# Patient Record
Sex: Male | Born: 1955 | Race: White | Hispanic: No | Marital: Single
Health system: Southern US, Community
[De-identification: ages and names within clinical notes are randomized; demographics above are authoritative.]

---

## 2014-07-04 ENCOUNTER — Other Ambulatory Visit: Payer: Self-pay

## 2014-07-04 ENCOUNTER — Other Ambulatory Visit (HOSPITAL_COMMUNITY): Payer: Self-pay

## 2014-07-04 ENCOUNTER — Inpatient Hospital Stay
Admission: AD | Admit: 2014-07-04 | Discharge: 2014-08-20 | Disposition: E | Payer: Self-pay | Source: Ambulatory Visit | Attending: Internal Medicine | Admitting: Internal Medicine

## 2014-07-04 DIAGNOSIS — J9690 Respiratory failure, unspecified, unspecified whether with hypoxia or hypercapnia: Secondary | ICD-10-CM | POA: Insufficient documentation

## 2014-07-04 DIAGNOSIS — J9601 Acute respiratory failure with hypoxia: Secondary | ICD-10-CM

## 2014-07-04 DIAGNOSIS — I2699 Other pulmonary embolism without acute cor pulmonale: Secondary | ICD-10-CM

## 2014-07-04 DIAGNOSIS — Z89519 Acquired absence of unspecified leg below knee: Secondary | ICD-10-CM

## 2014-07-04 DIAGNOSIS — R079 Chest pain, unspecified: Secondary | ICD-10-CM

## 2014-07-04 DIAGNOSIS — J189 Pneumonia, unspecified organism: Secondary | ICD-10-CM

## 2014-07-04 DIAGNOSIS — I739 Peripheral vascular disease, unspecified: Secondary | ICD-10-CM

## 2014-07-04 DIAGNOSIS — Z720 Tobacco use: Secondary | ICD-10-CM

## 2014-07-04 DIAGNOSIS — I509 Heart failure, unspecified: Secondary | ICD-10-CM

## 2014-07-04 DIAGNOSIS — I25810 Atherosclerosis of coronary artery bypass graft(s) without angina pectoris: Secondary | ICD-10-CM

## 2014-07-04 DIAGNOSIS — J441 Chronic obstructive pulmonary disease with (acute) exacerbation: Secondary | ICD-10-CM

## 2014-07-04 DIAGNOSIS — I639 Cerebral infarction, unspecified: Secondary | ICD-10-CM

## 2014-07-04 DIAGNOSIS — J811 Chronic pulmonary edema: Secondary | ICD-10-CM

## 2014-07-04 DIAGNOSIS — K219 Gastro-esophageal reflux disease without esophagitis: Secondary | ICD-10-CM

## 2014-07-04 DIAGNOSIS — Z95828 Presence of other vascular implants and grafts: Secondary | ICD-10-CM

## 2014-07-04 DIAGNOSIS — R627 Adult failure to thrive: Secondary | ICD-10-CM

## 2014-07-05 LAB — COMPREHENSIVE METABOLIC PANEL
ALT: 20 U/L (ref 0–53)
ANION GAP: 13 (ref 5–15)
AST: 22 U/L (ref 0–37)
Albumin: 2.3 g/dL — ABNORMAL LOW (ref 3.5–5.2)
Alkaline Phosphatase: 199 U/L — ABNORMAL HIGH (ref 39–117)
BUN: 11 mg/dL (ref 6–23)
CO2: 32 meq/L (ref 19–32)
Calcium: 8.2 mg/dL — ABNORMAL LOW (ref 8.4–10.5)
Chloride: 99 mEq/L (ref 96–112)
Creatinine, Ser: 0.62 mg/dL (ref 0.50–1.35)
GFR calc Af Amer: >90 mL/min (ref >90)
Glucose, Bld: 226 mg/dL — ABNORMAL HIGH (ref 70–99)
Potassium: 3.8 mEq/L (ref 3.7–5.3)
Sodium: 144 mEq/L (ref 137–147)
TOTAL PROTEIN: 6.5 g/dL (ref 6.0–8.3)
Total Bilirubin: 0.8 mg/dL (ref 0.3–1.2)

## 2014-07-05 LAB — CBC WITH DIFFERENTIAL/PLATELET
Basophils Absolute: 0 10*3/uL (ref 0.0–0.1)
Basophils Relative: 0 % (ref 0–1)
EOS ABS: 0.3 10*3/uL (ref 0.0–0.7)
EOS PCT: 2 % (ref 0–5)
HCT: 37.6 % — ABNORMAL LOW (ref 39.0–52.0)
Hemoglobin: 11.4 g/dL — ABNORMAL LOW (ref 13.0–17.0)
LYMPHS ABS: 1.5 10*3/uL (ref 0.7–4.0)
LYMPHS PCT: 12 % (ref 12–46)
MCH: 25.3 pg — AB (ref 26.0–34.0)
MCHC: 30.3 g/dL (ref 30.0–36.0)
MCV: 83.6 fL (ref 78.0–100.0)
MONOS PCT: 7 % (ref 3–12)
Monocytes Absolute: 0.8 10*3/uL (ref 0.1–1.0)
Neutro Abs: 9.7 10*3/uL — ABNORMAL HIGH (ref 1.7–7.7)
Neutrophils Relative %: 79 % — ABNORMAL HIGH (ref 43–77)
Platelets: 258 10*3/uL (ref 150–400)
RBC: 4.5 MIL/uL (ref 4.22–5.81)
RDW: 20.2 % — ABNORMAL HIGH (ref 11.5–15.5)
WBC: 12.3 10*3/uL — AB (ref 4.0–10.5)

## 2014-07-05 LAB — HEMOGLOBIN A1C
HEMOGLOBIN A1C: 9.8 % — AB (ref <5.7)
Mean Plasma Glucose: 235 mg/dL — ABNORMAL HIGH (ref <117)

## 2014-07-05 LAB — PRO B NATRIURETIC PEPTIDE: Pro B Natriuretic peptide (BNP): 6451 pg/mL — ABNORMAL HIGH (ref 0–125)

## 2014-07-05 LAB — TSH: TSH: 2.12 u[IU]/mL (ref 0.350–4.500)

## 2014-07-05 LAB — PROTIME-INR
INR: 2.47 — AB (ref 0.00–1.49)
PROTHROMBIN TIME: 27 s — AB (ref 11.6–15.2)

## 2014-07-05 LAB — VITAMIN B12: Vitamin B-12: 1286 pg/mL — ABNORMAL HIGH (ref 211–911)

## 2014-07-05 LAB — PHOSPHORUS: Phosphorus: 2.9 mg/dL (ref 2.3–4.6)

## 2014-07-05 LAB — MAGNESIUM: Magnesium: 1.6 mg/dL (ref 1.5–2.5)

## 2014-07-05 LAB — T4, FREE: FREE T4: 1.42 ng/dL (ref 0.80–1.80)

## 2014-07-06 LAB — CBC WITH DIFFERENTIAL/PLATELET
BASOS PCT: 0 % (ref 0–1)
Basophils Absolute: 0 10*3/uL (ref 0.0–0.1)
EOS PCT: 2 % (ref 0–5)
Eosinophils Absolute: 0.2 10*3/uL (ref 0.0–0.7)
HCT: 37.3 % — ABNORMAL LOW (ref 39.0–52.0)
Hemoglobin: 11.7 g/dL — ABNORMAL LOW (ref 13.0–17.0)
Lymphocytes Relative: 12 % (ref 12–46)
Lymphs Abs: 1.7 10*3/uL (ref 0.7–4.0)
MCH: 25.9 pg — AB (ref 26.0–34.0)
MCHC: 31.4 g/dL (ref 30.0–36.0)
MCV: 82.7 fL (ref 78.0–100.0)
MONOS PCT: 5 % (ref 3–12)
Monocytes Absolute: 0.7 10*3/uL (ref 0.1–1.0)
NEUTROS PCT: 81 % — AB (ref 43–77)
Neutro Abs: 10.9 10*3/uL — ABNORMAL HIGH (ref 1.7–7.7)
PLATELETS: 231 10*3/uL (ref 150–400)
RBC: 4.51 MIL/uL (ref 4.22–5.81)
RDW: 20 % — ABNORMAL HIGH (ref 11.5–15.5)
WBC: 13.6 10*3/uL — ABNORMAL HIGH (ref 4.0–10.5)

## 2014-07-06 LAB — BASIC METABOLIC PANEL
Anion gap: 15 (ref 5–15)
BUN: 8 mg/dL (ref 6–23)
CO2: 32 mEq/L (ref 19–32)
Calcium: 8.1 mg/dL — ABNORMAL LOW (ref 8.4–10.5)
Chloride: 95 mEq/L — ABNORMAL LOW (ref 96–112)
Creatinine, Ser: 0.64 mg/dL (ref 0.50–1.35)
Glucose, Bld: 156 mg/dL — ABNORMAL HIGH (ref 70–99)
POTASSIUM: 3 meq/L — AB (ref 3.7–5.3)
Sodium: 142 mEq/L (ref 137–147)

## 2014-07-06 LAB — URINALYSIS, ROUTINE W REFLEX MICROSCOPIC
BILIRUBIN URINE: NEGATIVE
Glucose, UA: NEGATIVE mg/dL
HGB URINE DIPSTICK: NEGATIVE
Ketones, ur: NEGATIVE mg/dL
Leukocytes, UA: NEGATIVE
Nitrite: NEGATIVE
PROTEIN: NEGATIVE mg/dL
SPECIFIC GRAVITY, URINE: 1.012 (ref 1.005–1.030)
Urobilinogen, UA: 0.2 mg/dL (ref 0.0–1.0)
pH: 7.5 (ref 5.0–8.0)

## 2014-07-06 LAB — PROTIME-INR
INR: 3.29 — ABNORMAL HIGH (ref 0.00–1.49)
Prothrombin Time: 33.7 seconds — ABNORMAL HIGH (ref 11.6–15.2)

## 2014-07-06 LAB — PHOSPHORUS: PHOSPHORUS: 3.1 mg/dL (ref 2.3–4.6)

## 2014-07-06 LAB — MAGNESIUM
Magnesium: 1.3 mg/dL — ABNORMAL LOW (ref 1.5–2.5)
Magnesium: 1.8 mg/dL (ref 1.5–2.5)

## 2014-07-06 LAB — FOLATE RBC

## 2014-07-06 LAB — POTASSIUM: Potassium: 3.2 mEq/L — ABNORMAL LOW (ref 3.7–5.3)

## 2014-07-07 ENCOUNTER — Other Ambulatory Visit (HOSPITAL_COMMUNITY): Payer: Self-pay

## 2014-07-07 LAB — BLOOD GAS, ARTERIAL
ACID-BASE EXCESS: 9.6 mmol/L — AB (ref 0.0–2.0)
Acid-Base Excess: 9.9 mmol/L — ABNORMAL HIGH (ref 0.0–2.0)
BICARBONATE: 33.2 meq/L — AB (ref 20.0–24.0)
Bicarbonate: 33.3 mEq/L — ABNORMAL HIGH (ref 20.0–24.0)
Delivery systems: POSITIVE
Expiratory PAP: 5
FIO2: 1 %
FIO2: 1 %
Inspiratory PAP: 10
O2 Content: 40 L/min
O2 Saturation: 86.8 %
O2 Saturation: 93.5 %
PATIENT TEMPERATURE: 98.6
PCO2 ART: 39.6 mmHg (ref 35.0–45.0)
PCO2 ART: 40.6 mmHg (ref 35.0–45.0)
PH ART: 7.535 — AB (ref 7.350–7.450)
PO2 ART: 70.8 mmHg — AB (ref 80.0–100.0)
Patient temperature: 98.6
RATE: 12 resp/min
TCO2: 34.5 mmol/L (ref 0–100)
TCO2: 34.4 mmol/L (ref 0–100)
pH, Arterial: 7.523 — ABNORMAL HIGH (ref 7.350–7.450)
pO2, Arterial: 54.3 mmHg — ABNORMAL LOW (ref 80.0–100.0)

## 2014-07-07 LAB — CBC WITH DIFFERENTIAL/PLATELET
BASOS PCT: 0 % (ref 0–1)
Basophils Absolute: 0 10*3/uL (ref 0.0–0.1)
Eosinophils Absolute: 0.3 10*3/uL (ref 0.0–0.7)
Eosinophils Relative: 2 % (ref 0–5)
HEMATOCRIT: 38.6 % — AB (ref 39.0–52.0)
HEMOGLOBIN: 11.9 g/dL — AB (ref 13.0–17.0)
LYMPHS ABS: 1.2 10*3/uL (ref 0.7–4.0)
LYMPHS PCT: 11 % — AB (ref 12–46)
MCH: 25.8 pg — ABNORMAL LOW (ref 26.0–34.0)
MCHC: 30.8 g/dL (ref 30.0–36.0)
MCV: 83.5 fL (ref 78.0–100.0)
MONO ABS: 0.8 10*3/uL (ref 0.1–1.0)
MONOS PCT: 7 % (ref 3–12)
NEUTROS PCT: 80 % — AB (ref 43–77)
Neutro Abs: 9 10*3/uL — ABNORMAL HIGH (ref 1.7–7.7)
Platelets: 240 10*3/uL (ref 150–400)
RBC: 4.62 MIL/uL (ref 4.22–5.81)
RDW: 20.1 % — ABNORMAL HIGH (ref 11.5–15.5)
WBC: 11.3 10*3/uL — AB (ref 4.0–10.5)

## 2014-07-07 LAB — BASIC METABOLIC PANEL
Anion gap: 9 (ref 5–15)
BUN: 8 mg/dL (ref 6–23)
CHLORIDE: 91 meq/L — AB (ref 96–112)
CO2: 35 meq/L — AB (ref 19–32)
CREATININE: 0.71 mg/dL (ref 0.50–1.35)
Calcium: 8.4 mg/dL (ref 8.4–10.5)
GFR calc non Af Amer: >90 mL/min (ref >90)
GLUCOSE: 145 mg/dL — AB (ref 70–99)
POTASSIUM: 3 meq/L — AB (ref 3.7–5.3)
Sodium: 135 mEq/L — ABNORMAL LOW (ref 137–147)

## 2014-07-07 LAB — MAGNESIUM: Magnesium: 1.9 mg/dL (ref 1.5–2.5)

## 2014-07-07 LAB — PHOSPHORUS: PHOSPHORUS: 2.6 mg/dL (ref 2.3–4.6)

## 2014-07-07 LAB — PROTIME-INR
INR: 2.45 — ABNORMAL HIGH (ref 0.00–1.49)
PROTHROMBIN TIME: 26.8 s — AB (ref 11.6–15.2)

## 2014-07-08 LAB — URINE CULTURE: Special Requests: NORMAL

## 2014-07-08 LAB — POTASSIUM: POTASSIUM: 4 meq/L (ref 3.7–5.3)

## 2014-07-08 LAB — PROTIME-INR
INR: 2.08 — AB (ref 0.00–1.49)
PROTHROMBIN TIME: 23.6 s — AB (ref 11.6–15.2)

## 2014-07-09 ENCOUNTER — Other Ambulatory Visit (HOSPITAL_COMMUNITY): Payer: Self-pay

## 2014-07-09 DIAGNOSIS — I5031 Acute diastolic (congestive) heart failure: Secondary | ICD-10-CM

## 2014-07-09 DIAGNOSIS — I509 Heart failure, unspecified: Secondary | ICD-10-CM

## 2014-07-09 DIAGNOSIS — I739 Peripheral vascular disease, unspecified: Secondary | ICD-10-CM

## 2014-07-09 DIAGNOSIS — I639 Cerebral infarction, unspecified: Secondary | ICD-10-CM

## 2014-07-09 DIAGNOSIS — J441 Chronic obstructive pulmonary disease with (acute) exacerbation: Secondary | ICD-10-CM

## 2014-07-09 DIAGNOSIS — I25810 Atherosclerosis of coronary artery bypass graft(s) without angina pectoris: Secondary | ICD-10-CM

## 2014-07-09 DIAGNOSIS — J9601 Acute respiratory failure with hypoxia: Secondary | ICD-10-CM

## 2014-07-09 DIAGNOSIS — R627 Adult failure to thrive: Secondary | ICD-10-CM

## 2014-07-09 DIAGNOSIS — Z720 Tobacco use: Secondary | ICD-10-CM

## 2014-07-09 DIAGNOSIS — Z89519 Acquired absence of unspecified leg below knee: Secondary | ICD-10-CM

## 2014-07-09 DIAGNOSIS — K219 Gastro-esophageal reflux disease without esophagitis: Secondary | ICD-10-CM

## 2014-07-09 LAB — PROTIME-INR
INR: 2.25 — AB (ref 0.00–1.49)
PROTHROMBIN TIME: 25.1 s — AB (ref 11.6–15.2)

## 2014-07-09 LAB — BASIC METABOLIC PANEL
Anion gap: 17 — ABNORMAL HIGH (ref 5–15)
BUN: 15 mg/dL (ref 6–23)
CO2: 27 mEq/L (ref 19–32)
Calcium: 8.9 mg/dL (ref 8.4–10.5)
Chloride: 93 mEq/L — ABNORMAL LOW (ref 96–112)
Creatinine, Ser: 0.77 mg/dL (ref 0.50–1.35)
Glucose, Bld: 199 mg/dL — ABNORMAL HIGH (ref 70–99)
POTASSIUM: 4.5 meq/L (ref 3.7–5.3)
SODIUM: 137 meq/L (ref 137–147)

## 2014-07-09 LAB — CBC
HCT: 39.1 % (ref 39.0–52.0)
Hemoglobin: 12.2 g/dL — ABNORMAL LOW (ref 13.0–17.0)
MCH: 26 pg (ref 26.0–34.0)
MCHC: 31.2 g/dL (ref 30.0–36.0)
MCV: 83.2 fL (ref 78.0–100.0)
PLATELETS: 305 10*3/uL (ref 150–400)
RBC: 4.7 MIL/uL (ref 4.22–5.81)
RDW: 20.3 % — AB (ref 11.5–15.5)
WBC: 23.3 10*3/uL — ABNORMAL HIGH (ref 4.0–10.5)

## 2014-07-09 NOTE — Consult Note (Signed)
Name: Guy Garrett MRN: [id number] DOB: 05/12/1956    ADMISSION DATE:  06/30/2014 CONSULTATION DATE: 12/21  [facility]  CHIEF COMPLAINT:  Hypoxia    SIGNIFICANT EVENTS    STUDIES:     HISTORY OF PRESENT ILLNESS:  58 yo male with a plethora of health issues. General failure to thrive. Vasculopath with ongoing lower ext ischemia , recent left BKA and rt foot severely  Ischemic. [facility] with fever and lower ext wound and required care in the ICU and continued high flow O2. And had left AKA  12/11. [facility] and remains hypoxic and lethargic   PAST MEDICAL HISTORY :   has no past medical history on file.  has no past surgical history on file. Prior to Admission medications   reviewed   Allergies: PCN Iodine Beef Shell fish Fish oil Onion FAMILY HISTORY:  family history is not on file. SOCIAL HISTORY:  Smoker  REVIEW OF SYSTEMS:   na  SUBJECTIVE:   VITAL SIGNS:   Vital signs reviewed. Abnormal values will appear under impression plan section.    PHYSICAL EXAMINATION: General:  Frail, dishevel, male who is lethargic   Neuro:  Lethargic but follows commands HEENT:  Poor dentition, no jvd/lan Cardiovascular:  HSD Lungs: Decreased air movement Abdomen:  Obese + bs Musculoskeletal: left aka with stump well approximated. Rt foot with dressing and severe ischemic changes Skin:  cool   Recent Labs Lab 07/06/14 0540  07/07/14 0700 07/08/14 0545 07/09/14 0540  NA 142  --  135*  --  137  K 3.0*  < > 3.0* 4.0 4.5  CL 95*  --  91*  --  93*  CO2 32  --  35*  --  27  BUN 8  --  8  --  15  CREATININE 0.64  --  0.71  --  0.77  GLUCOSE 156*  --  145*  --  199*  < > = values in this interval not displayed.  Recent Labs Lab 07/06/14 0540 07/07/14 0700 07/09/14 0540  HGB 11.7* 11.9* 12.2*  HCT 37.3* 38.6* 39.1  WBC 13.6* 11.3* 23.3*  PLT 231 240 305   Dg Chest Port 1 View  07/09/2014   CLINICAL DATA:  Pneumonia   EXAM: PORTABLE CHEST - 1 VIEW  COMPARISON:  07/07/2014  FINDINGS: Cardiac shadow remains enlarged. A defibrillator is again seen. Mild interstitial edema is again identified. No focal confluent infiltrate is seen.  IMPRESSION: Stable interstitial edema.   Electronically Signed   By: Alcide CleverMark  Lukens M.D.   On: 07/09/2014 11:40   Active Problems:   Acute respiratory failure with hypoxia   CHF (congestive heart failure)   CAD (coronary artery disease) of artery bypass graft   S/P AKA (above knee amputation)   Arterial insufficiency of lower extremity   PVD (peripheral vascular disease)   GERD (gastroesophageal reflux disease)   CVA (cerebral vascular accident)   FTT (failure to thrive) in adult   Tobacco abuse   COPD exacerbation  ASSESSMENT / PLAN:  Discussion: 58 yo male with a plethora of health issues. General failure to thrive. Vasculopath with ongoing lower ext ischemia , recent left BKA and rt foot severely  Ischemic. [facility] with fever and lower ext wound and required care in the ICU and continued high flow O2. And had left AKA  12/11. [facility] and remains hypoxic and lethargic   O2 as prescribed  IV lasix BD   CHF ID CVA CAD  [facility]   Steve Minor ACNP [facility] Pager [phone] till 3 pm If no answer page [phone] 07/09/2014, 2:04 PM     ATTENDING NOTE: I have personally reviewed patient's available data, including medical history, events of note, physical examination and test results as part of my evaluation. I have discussed with resident/NP and other careteam providers such as pharmacist, RN and RRT & co-ordinated with consultants. In addition, I personally evaluated patient and elicited key history of severe hypoxia, exam findings of high flow oxygen, clear lungs, 2+ edema & labs showing INR 2.2, leucocytosis   Unclear cause of severe hypoxia with clear CXR - INR high so doubt PE - severe COPD , interstitial edema & pulmonary  hypertension are possible causes. Will diurese & see if hypoxia improves. Seems to be ventilating OK on ABG - if worse may need bipap or intubation. Rest per [facility] note is outlined above and that I agree with and edited in full.    Oretha MilchALVA,Jagar Lua V. MD

## 2014-07-10 ENCOUNTER — Other Ambulatory Visit (HOSPITAL_COMMUNITY): Payer: Self-pay

## 2014-07-10 LAB — CBC WITH DIFFERENTIAL/PLATELET
BASOS PCT: 0 % (ref 0–1)
Basophils Absolute: 0 10*3/uL (ref 0.0–0.1)
EOS PCT: 0 % (ref 0–5)
Eosinophils Absolute: 0.1 10*3/uL (ref 0.0–0.7)
HCT: 33.1 % — ABNORMAL LOW (ref 39.0–52.0)
HEMOGLOBIN: 10.3 g/dL — AB (ref 13.0–17.0)
LYMPHS ABS: 1.1 10*3/uL (ref 0.7–4.0)
Lymphocytes Relative: 6 % — ABNORMAL LOW (ref 12–46)
MCH: 26.3 pg (ref 26.0–34.0)
MCHC: 31.1 g/dL (ref 30.0–36.0)
MCV: 84.7 fL (ref 78.0–100.0)
MONO ABS: 1.3 10*3/uL — AB (ref 0.1–1.0)
MONOS PCT: 6 % (ref 3–12)
NEUTROS PCT: 88 % — AB (ref 43–77)
Neutro Abs: 17.4 10*3/uL — ABNORMAL HIGH (ref 1.7–7.7)
Platelets: 260 10*3/uL (ref 150–400)
RBC: 3.91 MIL/uL — AB (ref 4.22–5.81)
RDW: 20.2 % — ABNORMAL HIGH (ref 11.5–15.5)
WBC: 19.9 10*3/uL — ABNORMAL HIGH (ref 4.0–10.5)

## 2014-07-10 LAB — BLOOD GAS, ARTERIAL
ACID-BASE EXCESS: 5.7 mmol/L — AB (ref 0.0–2.0)
Bicarbonate: 28.3 mEq/L — ABNORMAL HIGH (ref 20.0–24.0)
DRAWN BY: 129711
FIO2: 0.95 %
O2 Content: 35 L/min
O2 Saturation: 92.6 %
PATIENT TEMPERATURE: 100.7
PH ART: 7.546 — AB (ref 7.350–7.450)
TCO2: 29.3 mmol/L (ref 0–100)
pCO2 arterial: 33.1 mmHg — ABNORMAL LOW (ref 35.0–45.0)
pO2, Arterial: 64.6 mmHg — ABNORMAL LOW (ref 80.0–100.0)

## 2014-07-10 LAB — MAGNESIUM: Magnesium: 1.5 mg/dL (ref 1.5–2.5)

## 2014-07-10 LAB — URINE MICROSCOPIC-ADD ON

## 2014-07-10 LAB — URINALYSIS, ROUTINE W REFLEX MICROSCOPIC
BILIRUBIN URINE: NEGATIVE
GLUCOSE, UA: NEGATIVE mg/dL
HGB URINE DIPSTICK: NEGATIVE
Ketones, ur: 15 mg/dL — AB
Leukocytes, UA: NEGATIVE
Nitrite: NEGATIVE
PROTEIN: 30 mg/dL — AB
Specific Gravity, Urine: 1.019 (ref 1.005–1.030)
Urobilinogen, UA: 0.2 mg/dL (ref 0.0–1.0)
pH: 6 (ref 5.0–8.0)

## 2014-07-10 LAB — BASIC METABOLIC PANEL
Anion gap: 12 (ref 5–15)
BUN: 19 mg/dL (ref 6–23)
CALCIUM: 8.4 mg/dL (ref 8.4–10.5)
CO2: 31 mmol/L (ref 19–32)
CREATININE: 0.93 mg/dL (ref 0.50–1.35)
Chloride: 94 mEq/L — ABNORMAL LOW (ref 96–112)
GLUCOSE: 305 mg/dL — AB (ref 70–99)
Potassium: 3.6 mmol/L (ref 3.5–5.1)
Sodium: 137 mmol/L (ref 135–145)

## 2014-07-10 LAB — PROTIME-INR
INR: 2.8 — ABNORMAL HIGH (ref 0.00–1.49)
Prothrombin Time: 29.7 seconds — ABNORMAL HIGH (ref 11.6–15.2)

## 2014-07-10 LAB — C-REACTIVE PROTEIN: CRP: 35.6 mg/dL — ABNORMAL HIGH (ref <0.60)

## 2014-07-10 LAB — CLOSTRIDIUM DIFFICILE BY PCR: Toxigenic C. Difficile by PCR: NEGATIVE

## 2014-07-10 LAB — PHOSPHORUS: PHOSPHORUS: 3 mg/dL (ref 2.3–4.6)

## 2014-07-10 LAB — SEDIMENTATION RATE: Sed Rate: 98 mm/hr — ABNORMAL HIGH (ref 0–16)

## 2014-07-10 MED ORDER — TECHNETIUM TO 99M ALBUMIN AGGREGATED
6.0000 | Freq: Once | INTRAVENOUS | Status: AC | PRN
  Administered 2014-07-10: 6 via INTRAVENOUS

## 2014-07-10 MED ORDER — TECHNETIUM TC 99M DIETHYLENETRIAME-PENTAACETIC ACID: 40.0000 | Freq: Once | INTRAVENOUS | Status: AC | PRN

## 2014-07-10 NOTE — Progress Notes (Signed)
Name: Chrystie Noseaul Lenderman MRN: [id number] DOB: 12/30/1955    ADMISSION DATE:  07/12/2014 CONSULTATION DATE: 12/21  [facility]  CHIEF COMPLAINT:  Hypoxia    SIGNIFICANT EVENTS    STUDIES:     HISTORY OF PRESENT ILLNESS:  58 yo male with a plethora of health issues. General failure to thrive. Vasculopath with ongoing lower ext ischemia , recent left BKA and rt foot severely  Ischemic. [facility] and lower ext wound and required care in the ICU and continued high flow O2. And had left AKA  12/11. [facility] and remains hypoxic and lethargic     SUBJECTIVE:   VITAL SIGNS:   Vital signs reviewed. Abnormal values will appear under impression plan section.    PHYSICAL EXAMINATION: General:  Frail, dishevel, male who is more awake now that he is on bipap  Neuro:  More alert HEENT:  Poor dentition, no jvd/lan, fm in place Cardiovascular:  HSD Lungs: Decreased air movement despite bipap Abdomen:  Obese + bs Musculoskeletal: left aka with stump well approximated. Rt foot with dressing and severe ischemic changes Skin:  cool   Recent Labs Lab 07/07/14 0700 07/08/14 0545 07/09/14 0540 07/10/14 0500  NA 135*  --  137 137  K 3.0* 4.0 4.5 3.6  CL 91*  --  93* 94*  CO2 35*  --  27 31  BUN 8  --  15 19  CREATININE 0.71  --  0.77 0.93  GLUCOSE 145*  --  199* 305*    Recent Labs Lab 07/07/14 0700 07/09/14 0540 07/10/14 0500  HGB 11.9* 12.2* 10.3*  HCT 38.6* 39.1 33.1*  WBC 11.3* 23.3* 19.9*  PLT 240 305 260   Dg Chest Port 1 View  07/09/2014   CLINICAL DATA:  Central catheter placement  EXAM: PORTABLE CHEST - 1 VIEW  COMPARISON:  Study obtained earlier in the day  FINDINGS: The central catheter tip is at the cavoatrial junction. No pneumothorax. Heart is enlarged with pulmonary vascularity within normal limits. Pacemaker lead tip is attached to the right ventricle. No appreciable edema or consolidation.  IMPRESSION: Central  catheter tip at cavoatrial junction. No pneumothorax. Stable cardiac prominence. No edema or consolidation.   Electronically Signed   By: Bretta BangWilliam  Woodruff M.D.   On: 07/09/2014 19:20   Dg Chest Port 1 View  07/09/2014   CLINICAL DATA:  Central catheter placement  EXAM: PORTABLE CHEST - 1 VIEW  COMPARISON:  Study obtained earlier in the day  FINDINGS: There is a central catheter with the tip in the superior vena cava region. The tip of the catheter loops and is directed superiorly. No pneumothorax. There is no edema or consolidation. Heart is mildly enlarged with pulmonary vascularity within normal limits. No adenopathy. Patient is status post coronary artery bypass grafting.  IMPRESSION: Central catheter tip in superior vena cava. The catheter loops distally with the tip directed superiorly. No pneumothorax. Lungs clear. Heart prominent but stable.  These results were called by telephone at the time of interpretation on 07/09/2014 at 5:34 pm to [facility]UN LI , who verbally acknowledged these results.   Electronically Signed   By: Bretta BangWilliam  Woodruff M.D.   On: 07/09/2014 17:34   Dg Chest Port 1 View  07/09/2014   CLINICAL DATA:  Pneumonia  EXAM: PORTABLE CHEST - 1 VIEW  COMPARISON:  07/07/2014  FINDINGS: Cardiac shadow remains enlarged. A defibrillator is again seen. Mild interstitial edema is again identified.  No focal confluent infiltrate is seen.  IMPRESSION: Stable interstitial edema.   Electronically Signed   By: Alcide CleverMark  Lukens M.D.   On: 07/09/2014 11:40   Active Problems:   Acute respiratory failure with hypoxia   CHF (congestive heart failure)   CAD (coronary artery disease) of artery bypass graft   S/P AKA (above knee amputation)   Arterial insufficiency of lower extremity   PVD (peripheral vascular disease)   GERD (gastroesophageal reflux disease)   CVA (cerebral vascular accident)   FTT (failure to thrive) in adult   Tobacco abuse   COPD exacerbation  ASSESSMENT / PLAN:  Discussion: 58 yo  male with a plethora of health issues. General failure to thrive. Vasculopath with ongoing lower ext ischemia , recent left BKA and rt foot severely  Ischemic. [facility] fever and lower ext wound and required care in the ICU and continued high flow O2. And had left AKA  12/11. [facility] and remains hypoxic and lethargic. 12/22 now requiring NIMVS with FIO2 70%.  ABG's indicative of increased resp drive but still hypoxic.  O2 as prescribed.  IV lasix BD Consider 2 d echo to eval cardiac performance  CHF ID CVA CAD  [facility]   Brett CanalesSteve Minor ACNP [facility] Pager [phone] till 3 pm If no answer page [phone]  PCCM ATTENDING: His hypoxia is mildly improved with diuresis. His WOB remains low which with clear CXR suggests either obstructive lung disease or pulmonary hypertension as cause of severe hypoxia. Would use lasix gtt to achieve further diuresis. Intubate only if evidence of hypercarbia or worsening mental status/ WOB Would defer VQ scan since anticoagulated already & high risk transport  ALVA,RAKESH V. MD 07/10/2014, 11:28 AM

## 2014-07-11 ENCOUNTER — Other Ambulatory Visit (HOSPITAL_COMMUNITY): Payer: Self-pay

## 2014-07-11 LAB — PROTIME-INR
INR: 3.63 — AB (ref 0.00–1.49)
PROTHROMBIN TIME: 36.4 s — AB (ref 11.6–15.2)

## 2014-07-11 LAB — COMPREHENSIVE METABOLIC PANEL
ALBUMIN: 2 g/dL — AB (ref 3.5–5.2)
ALT: 28 U/L (ref 0–53)
AST: 66 U/L — ABNORMAL HIGH (ref 0–37)
Alkaline Phosphatase: 168 U/L — ABNORMAL HIGH (ref 39–117)
Anion gap: 14 (ref 5–15)
BILIRUBIN TOTAL: 1.4 mg/dL — AB (ref 0.3–1.2)
BUN: 19 mg/dL (ref 6–23)
CHLORIDE: 94 meq/L — AB (ref 96–112)
CO2: 28 mmol/L (ref 19–32)
Calcium: 8.1 mg/dL — ABNORMAL LOW (ref 8.4–10.5)
Creatinine, Ser: 0.98 mg/dL (ref 0.50–1.35)
GFR calc Af Amer: >90 mL/min (ref >90)
GFR calc non Af Amer: >90 mL/min (ref >90)
Glucose, Bld: 228 mg/dL — ABNORMAL HIGH (ref 70–99)
Potassium: 3.2 mmol/L — ABNORMAL LOW (ref 3.5–5.1)
Sodium: 136 mmol/L (ref 135–145)
Total Protein: 6.7 g/dL (ref 6.0–8.3)

## 2014-07-11 LAB — MAGNESIUM: MAGNESIUM: 1.5 mg/dL (ref 1.5–2.5)

## 2014-07-11 LAB — CBC WITH DIFFERENTIAL/PLATELET
Basophils Absolute: 0 10*3/uL (ref 0.0–0.1)
Basophils Relative: 0 % (ref 0–1)
Eosinophils Absolute: 0.3 10*3/uL (ref 0.0–0.7)
Eosinophils Relative: 2 % (ref 0–5)
HEMATOCRIT: 32.6 % — AB (ref 39.0–52.0)
Hemoglobin: 10.1 g/dL — ABNORMAL LOW (ref 13.0–17.0)
Lymphocytes Relative: 8 % — ABNORMAL LOW (ref 12–46)
Lymphs Abs: 0.9 10*3/uL (ref 0.7–4.0)
MCH: 26.4 pg (ref 26.0–34.0)
MCHC: 31 g/dL (ref 30.0–36.0)
MCV: 85.3 fL (ref 78.0–100.0)
MONO ABS: 0.9 10*3/uL (ref 0.1–1.0)
Monocytes Relative: 8 % (ref 3–12)
NEUTROS ABS: 9.1 10*3/uL — AB (ref 1.7–7.7)
NEUTROS PCT: 82 % — AB (ref 43–77)
Platelets: 268 10*3/uL (ref 150–400)
RBC: 3.82 MIL/uL — ABNORMAL LOW (ref 4.22–5.81)
RDW: 20.5 % — AB (ref 11.5–15.5)
WBC: 11.2 10*3/uL — ABNORMAL HIGH (ref 4.0–10.5)

## 2014-07-11 LAB — PHOSPHORUS: Phosphorus: 3.1 mg/dL (ref 2.3–4.6)

## 2014-07-11 LAB — BRAIN NATRIURETIC PEPTIDE: B NATRIURETIC PEPTIDE 5: 403.3 pg/mL — AB (ref 0.0–100.0)

## 2014-07-11 NOTE — Progress Notes (Signed)
Name: Guy Garrett MRN: [id number] DOB: 01/06/1956    ADMISSION DATE:  25-Jan-2014 CONSULTATION DATE: 12/21  [facility]  CHIEF COMPLAINT:  Hypoxia    SIGNIFICANT EVENTS    STUDIES:     HISTORY OF PRESENT ILLNESS:  58 yo male with a plethora of health issues. General failure to thrive. Vasculopath with ongoing lower ext ischemia , recent left BKA and rt foot severely  Ischemic. [facility] fever and lower ext wound and required care in the ICU and continued high flow O2. And had left AKA  12/11. [facility] and remains hypoxic and lethargic     SUBJECTIVE:   VITAL SIGNS:   Vital signs reviewed. Abnormal values will appear under impression plan section.    PHYSICAL EXAMINATION: General:  Frail, dishevel, male who is more awake off bipap HEENT:  Poor dentition, no jvd/lan Cardiovascular:  HSD Lungs: Decreased air movement  Abdomen:  Obese + bs Musculoskeletal: left aka with stump well approximated. Rt foot with dressing and severe ischemic changes Skin:  cool   Recent Labs Lab 07/09/14 0540 07/10/14 0500 07/11/14 0710  NA 137 137 136  K 4.5 3.6 3.2*  CL 93* 94* 94*  CO2 27 31 28   BUN 15 19 19   CREATININE 0.77 0.93 0.98  GLUCOSE 199* 305* 228*    Recent Labs Lab 07/09/14 0540 07/10/14 0500 07/11/14 0710  HGB 12.2* 10.3* 10.1*  HCT 39.1 33.1* 32.6*  WBC 23.3* 19.9* 11.2*  PLT 305 260 268   Nm Pulmonary Perf And Vent  07/10/2014   CLINICAL DATA:  Respiratory failure with hypoxia  EXAM: NUCLEAR MEDICINE VENTILATION - PERFUSION LUNG SCAN  TECHNIQUE: Ventilation images were obtained in multiple projections using inhaled aerosol technetium 99 M DTPA. Perfusion images were obtained in multiple projections after intravenous injection of Tc-5875m MAA.  RADIOPHARMACEUTICALS:  40 mCi Tc-10275m DTPA aerosol and 6 mCi Tc-4775m MAA  COMPARISON:  07/09/2014  FINDINGS: Ventilation: No focal ventilation defect.  Perfusion: No wedge shaped  peripheral perfusion defects to suggest acute pulmonary embolism. A defect is noted related to the pacemaker anteriorly on the left.  IMPRESSION: No evidence of pulmonary emboli.   Electronically Signed   By: Alcide CleverMark  Lukens M.D.   On: 07/10/2014 14:15   Dg Chest Port 1 View  07/11/2014   CLINICAL DATA:  Pulmonary edema.  EXAM: PORTABLE CHEST - 1 VIEW  COMPARISON:  07/09/2014  FINDINGS: AICD in place. PICC tip at the cavoatrial junction. Persistent cardiomegaly. Pulmonary vascularity is normal. No infiltrates or effusions.  IMPRESSION: Cardiomegaly.  Lungs are clear.   Electronically Signed   By: Geanie CooleyJim  Maxwell M.D.   On: 07/11/2014 08:00   Dg Chest Port 1 View  07/09/2014   CLINICAL DATA:  Central catheter placement  EXAM: PORTABLE CHEST - 1 VIEW  COMPARISON:  Study obtained earlier in the day  FINDINGS: The central catheter tip is at the cavoatrial junction. No pneumothorax. Heart is enlarged with pulmonary vascularity within normal limits. Pacemaker lead tip is attached to the right ventricle. No appreciable edema or consolidation.  IMPRESSION: Central catheter tip at cavoatrial junction. No pneumothorax. Stable cardiac prominence. No edema or consolidation.   Electronically Signed   By: Bretta BangWilliam  Woodruff M.D.   On: 07/09/2014 19:20   Dg Chest Port 1 View  07/09/2014   CLINICAL DATA:  Central catheter placement  EXAM: PORTABLE CHEST - 1 VIEW  COMPARISON:  Study obtained earlier in the day  FINDINGS: There is a central catheter with the tip in the superior vena cava region. The tip of the catheter loops and is directed superiorly. No pneumothorax. There is no edema or consolidation. Heart is mildly enlarged with pulmonary vascularity within normal limits. No adenopathy. Patient is status post coronary artery bypass grafting.  IMPRESSION: Central catheter tip in superior vena cava. The catheter loops distally with the tip directed superiorly. No pneumothorax. Lungs clear. Heart prominent but stable.  These  results were called by telephone at the time of interpretation on 07/09/2014 at 5:34 pm to [facility] , who verbally acknowledged these results.   Electronically Signed   By: Bretta BangWilliam  Woodruff M.D.   On: 07/09/2014 17:34   Dg Chest Port 1 View  07/09/2014   CLINICAL DATA:  Pneumonia  EXAM: PORTABLE CHEST - 1 VIEW  COMPARISON:  07/07/2014  FINDINGS: Cardiac shadow remains enlarged. A defibrillator is again seen. Mild interstitial edema is again identified. No focal confluent infiltrate is seen.  IMPRESSION: Stable interstitial edema.   Electronically Signed   By: Alcide CleverMark  Lukens M.D.   On: 07/09/2014 11:40   Active Problems:   Acute respiratory failure with hypoxia   CHF (congestive heart failure)   CAD (coronary artery disease) of artery bypass graft   S/P AKA (above knee amputation)   Arterial insufficiency of lower extremity   PVD (peripheral vascular disease)   GERD (gastroesophageal reflux disease)   CVA (cerebral vascular accident)   FTT (failure to thrive) in adult   Tobacco abuse   COPD exacerbation  ASSESSMENT / PLAN:  Discussion: 58 yo male with a plethora of health issues. General failure to thrive. Vasculopath with ongoing lower ext ischemia , recent left BKA and rt foot severely  Ischemic. [facility] with fever and lower ext wound and required care in the ICU and continued high flow O2. And had left AKA  12/11. [facility] and remains hypoxic and lethargic. 12/22 now requiring NIMVS with FIO2 70%.  ABG's indicative of increased resp drive but still hypoxic. 16/1012/23 better, more alert, no resp distress. Negative i/o O2 as prescribed.  IV lasix BD Consider 2 d echo to eval cardiac performance  CHF ID CVA CAD  [facility]  PCCM will see again on Monday.  Brett CanalesSteve Minor ACNP [facility] Pager [phone] till 3 pm If no answer page [phone]  Will need continued diureses, hypoxemia improving but remains in need of high flow O2.  No indication for intubation  at this point.  Will see again on Monday, please call sooner if additional help is needed.  Patient seen and examined, agree with above note.  I dictated the care and orders written for this patient under my direction.  Alyson ReedyWesam G Cassundra Mckeever, MD [phone]

## 2014-07-12 LAB — CBC WITH DIFFERENTIAL/PLATELET
BASOS ABS: 0 10*3/uL (ref 0.0–0.1)
Basophils Relative: 0 % (ref 0–1)
Eosinophils Absolute: 0 10*3/uL (ref 0.0–0.7)
Eosinophils Relative: 1 % (ref 0–5)
HCT: 32.9 % — ABNORMAL LOW (ref 39.0–52.0)
Hemoglobin: 10.2 g/dL — ABNORMAL LOW (ref 13.0–17.0)
Lymphocytes Relative: 7 % — ABNORMAL LOW (ref 12–46)
Lymphs Abs: 0.5 10*3/uL — ABNORMAL LOW (ref 0.7–4.0)
MCH: 25.8 pg — ABNORMAL LOW (ref 26.0–34.0)
MCHC: 31 g/dL (ref 30.0–36.0)
MCV: 83.1 fL (ref 78.0–100.0)
Monocytes Absolute: 0.1 10*3/uL (ref 0.1–1.0)
Monocytes Relative: 1 % — ABNORMAL LOW (ref 3–12)
NEUTROS ABS: 6.3 10*3/uL (ref 1.7–7.7)
Neutrophils Relative %: 91 % — ABNORMAL HIGH (ref 43–77)
PLATELETS: 271 10*3/uL (ref 150–400)
RBC: 3.96 MIL/uL — ABNORMAL LOW (ref 4.22–5.81)
RDW: 20.3 % — AB (ref 11.5–15.5)
WBC: 7 10*3/uL (ref 4.0–10.5)

## 2014-07-12 LAB — BASIC METABOLIC PANEL
ANION GAP: 15 (ref 5–15)
BUN: 23 mg/dL (ref 6–23)
CHLORIDE: 95 meq/L — AB (ref 96–112)
CO2: 26 mmol/L (ref 19–32)
Calcium: 8.2 mg/dL — ABNORMAL LOW (ref 8.4–10.5)
Creatinine, Ser: 0.84 mg/dL (ref 0.50–1.35)
GFR calc Af Amer: >90 mL/min (ref >90)
Glucose, Bld: 337 mg/dL — ABNORMAL HIGH (ref 70–99)
Potassium: 3.9 mmol/L (ref 3.5–5.1)
Sodium: 136 mmol/L (ref 135–145)

## 2014-07-12 LAB — PHOSPHORUS: PHOSPHORUS: 2.6 mg/dL (ref 2.3–4.6)

## 2014-07-12 LAB — PROTIME-INR
INR: 4 — AB (ref 0.00–1.49)
PROTHROMBIN TIME: 39.3 s — AB (ref 11.6–15.2)

## 2014-07-12 LAB — GLUCOSE, RANDOM: GLUCOSE: 455 mg/dL — AB (ref 70–99)

## 2014-07-12 LAB — URINE CULTURE

## 2014-07-12 LAB — MAGNESIUM: Magnesium: 1.6 mg/dL (ref 1.5–2.5)

## 2014-07-12 NOTE — Progress Notes (Addendum)
Name: Guy Garrett MRN: [id number] DOB: 01/18/1956    ADMISSION DATE:  Feb 23, 2014 CONSULTATION DATE: 12/21  [facility] :  [facility]  CHIEF COMPLAINT:  Hypoxia    SIGNIFICANT EVENTS    STUDIES:     HISTORY OF PRESENT ILLNESS:  58 yo male with a plethora of health issues. General failure to thrive. Vasculopath with ongoing lower ext ischemia , recent left BKA and rt foot severely  Ischemic. [facility] fever and lower ext wound and required care in the ICU and continued high flow O2. And had left AKA  12/11. [facility] and remains hypoxic and lethargic     SUBJECTIVE:   VITAL SIGNS:   Vital signs reviewed. Abnormal values will appear under impression plan section.    PHYSICAL EXAMINATION: General:  Frail, dishevel, male who is more awake off bipap, continues to improve HEENT:  Poor dentition, no jvd/lan Cardiovascular:  HSD Lungs: Decreased air movement  Abdomen:  Obese + bs Musculoskeletal: left aka with stump well approximated. Rt foot with dressing and severe ischemic changes Skin:  cool   Recent Labs Lab 07/10/14 0500 07/11/14 0710 07/12/14 0620  NA 137 136 136  K 3.6 3.2* 3.9  CL 94* 94* 95*  CO2 31 28 26   BUN 19 19 23   CREATININE 0.93 0.98 0.84  GLUCOSE 305* 228* 337*    Recent Labs Lab 07/10/14 0500 07/11/14 0710 07/12/14 0620  HGB 10.3* 10.1* 10.2*  HCT 33.1* 32.6* 32.9*  WBC 19.9* 11.2* 7.0  PLT 260 268 271   Nm Pulmonary Perf And Vent  07/10/2014   CLINICAL DATA:  Respiratory failure with hypoxia  EXAM: NUCLEAR MEDICINE VENTILATION - PERFUSION LUNG SCAN  TECHNIQUE: Ventilation images were obtained in multiple projections using inhaled aerosol technetium 99 M DTPA. Perfusion images were obtained in multiple projections after intravenous injection of Tc-5153m MAA.  RADIOPHARMACEUTICALS:  40 mCi Tc-653m DTPA aerosol and 6 mCi Tc-5753m MAA  COMPARISON:  07/09/2014  FINDINGS: Ventilation: No focal ventilation defect.   Perfusion: No wedge shaped peripheral perfusion defects to suggest acute pulmonary embolism. A defect is noted related to the pacemaker anteriorly on the left.  IMPRESSION: No evidence of pulmonary emboli.   Electronically Signed   By: Alcide CleverMark  Lukens M.D.   On: 07/10/2014 14:15   Dg Chest Port 1 View  07/11/2014   CLINICAL DATA:  Pulmonary edema.  EXAM: PORTABLE CHEST - 1 VIEW  COMPARISON:  07/09/2014  FINDINGS: AICD in place. PICC tip at the cavoatrial junction. Persistent cardiomegaly. Pulmonary vascularity is normal. No infiltrates or effusions.  IMPRESSION: Cardiomegaly.  Lungs are clear.   Electronically Signed   By: Geanie CooleyJim  Maxwell M.D.   On: 07/11/2014 08:00   Active Problems:   Acute respiratory failure with hypoxia   CHF (congestive heart failure)   CAD (coronary artery disease) of artery bypass graft   S/P AKA (above knee amputation)   Arterial insufficiency of lower extremity   PVD (peripheral vascular disease)   GERD (gastroesophageal reflux disease)   CVA (cerebral vascular accident)   FTT (failure to thrive) in adult   Tobacco abuse   COPD exacerbation  ASSESSMENT / PLAN:  Discussion: 58 yo male with a plethora of health issues. General failure to thrive. Vasculopath with ongoing lower ext ischemia , recent left BKA and rt foot severely  Ischemic. [facility] fever and lower ext wound and required care in the ICU and continued high flow O2. And  had left AKA  12/11. [facility] and remains hypoxic and lethargic. 12/22 now requiring NIMVS with FIO2 70%.  ABG's indicative of increased resp drive but still hypoxic. 16/1012/23 better, more alert, no resp distress. Negative i/o O2 as prescribed.  IV lasix BD Consider 2 d echo to eval cardiac performance  CHF ID CVA CAD  [facility]  Brett CanalesSteve Minor ACNP [facility] Pager [phone] till 3 pm If no answer page [phone]  Remains profoundly hypoxic.  Now on NIV.  Need to clarify code status prior to any  consideration of intubation given the patient's plethora of medical problems and poor functional status.  Will discuss with [facility]-MD.  Patient seen and examined, agree with above note.  I dictated the care and orders written for this patient under my direction.  Alyson ReedyWesam G Yacoub, MD [phone]

## 2014-07-13 LAB — PROTIME-INR
INR: 3.64 — ABNORMAL HIGH (ref 0.00–1.49)
Prothrombin Time: 36.5 seconds — ABNORMAL HIGH (ref 11.6–15.2)

## 2014-07-14 DIAGNOSIS — I369 Nonrheumatic tricuspid valve disorder, unspecified: Secondary | ICD-10-CM

## 2014-07-14 LAB — POTASSIUM: POTASSIUM: 3.7 mmol/L (ref 3.5–5.1)

## 2014-07-14 LAB — BASIC METABOLIC PANEL
ANION GAP: 10 (ref 5–15)
BUN: 23 mg/dL (ref 6–23)
CALCIUM: 8.1 mg/dL — AB (ref 8.4–10.5)
CO2: 32 mmol/L (ref 19–32)
Chloride: 95 mEq/L — ABNORMAL LOW (ref 96–112)
Creatinine, Ser: 0.82 mg/dL (ref 0.50–1.35)
GFR calc Af Amer: >90 mL/min (ref >90)
GFR calc non Af Amer: >90 mL/min (ref >90)
Glucose, Bld: 316 mg/dL — ABNORMAL HIGH (ref 70–99)
Potassium: 2.6 mmol/L — CL (ref 3.5–5.1)
SODIUM: 137 mmol/L (ref 135–145)

## 2014-07-14 LAB — CBC
HCT: 35.2 % — ABNORMAL LOW (ref 39.0–52.0)
HEMOGLOBIN: 10.7 g/dL — AB (ref 13.0–17.0)
MCH: 25.5 pg — AB (ref 26.0–34.0)
MCHC: 30.4 g/dL (ref 30.0–36.0)
MCV: 83.8 fL (ref 78.0–100.0)
Platelets: 292 10*3/uL (ref 150–400)
RBC: 4.2 MIL/uL — AB (ref 4.22–5.81)
RDW: 20.1 % — ABNORMAL HIGH (ref 11.5–15.5)
WBC: 15.5 10*3/uL — ABNORMAL HIGH (ref 4.0–10.5)

## 2014-07-14 LAB — PROTIME-INR
INR: 3.66 — AB (ref 0.00–1.49)
Prothrombin Time: 36.6 seconds — ABNORMAL HIGH (ref 11.6–15.2)

## 2014-07-14 LAB — BRAIN NATRIURETIC PEPTIDE: B Natriuretic Peptide: 1215.7 pg/mL — ABNORMAL HIGH (ref 0.0–100.0)

## 2014-07-14 LAB — GLUCOSE, RANDOM: GLUCOSE: 448 mg/dL — AB (ref 70–99)

## 2014-07-14 NOTE — Progress Notes (Signed)
Echocardiogram 2D Echocardiogram has been performed.  Dorothey BasemanReel, Venesa Semidey M 07/14/2014, 2:38 PM

## 2014-07-15 LAB — CBC WITH DIFFERENTIAL/PLATELET
BASOS ABS: 0 10*3/uL (ref 0.0–0.1)
Basophils Relative: 0 % (ref 0–1)
EOS PCT: 1 % (ref 0–5)
Eosinophils Absolute: 0.1 10*3/uL (ref 0.0–0.7)
HEMATOCRIT: 36.9 % — AB (ref 39.0–52.0)
HEMOGLOBIN: 11.1 g/dL — AB (ref 13.0–17.0)
LYMPHS ABS: 0.8 10*3/uL (ref 0.7–4.0)
LYMPHS PCT: 6 % — AB (ref 12–46)
MCH: 25.6 pg — ABNORMAL LOW (ref 26.0–34.0)
MCHC: 30.1 g/dL (ref 30.0–36.0)
MCV: 85 fL (ref 78.0–100.0)
MONO ABS: 0.4 10*3/uL (ref 0.1–1.0)
MONOS PCT: 3 % (ref 3–12)
Neutro Abs: 12.1 10*3/uL — ABNORMAL HIGH (ref 1.7–7.7)
Neutrophils Relative %: 90 % — ABNORMAL HIGH (ref 43–77)
Platelets: 301 10*3/uL (ref 150–400)
RBC: 4.34 MIL/uL (ref 4.22–5.81)
RDW: 20.4 % — ABNORMAL HIGH (ref 11.5–15.5)
WBC: 13.4 10*3/uL — AB (ref 4.0–10.5)

## 2014-07-15 LAB — PROCALCITONIN: PROCALCITONIN: 0.33 ng/mL

## 2014-07-15 LAB — BASIC METABOLIC PANEL
ANION GAP: 11 (ref 5–15)
BUN: 27 mg/dL — AB (ref 6–23)
CALCIUM: 8 mg/dL — AB (ref 8.4–10.5)
CO2: 33 mmol/L — AB (ref 19–32)
CREATININE: 0.73 mg/dL (ref 0.50–1.35)
Chloride: 93 mEq/L — ABNORMAL LOW (ref 96–112)
GLUCOSE: 258 mg/dL — AB (ref 70–99)
Potassium: 3.5 mmol/L (ref 3.5–5.1)
Sodium: 137 mmol/L (ref 135–145)

## 2014-07-15 LAB — PROTIME-INR
INR: 5.49 (ref 0.00–1.49)
Prothrombin Time: 50.3 s — ABNORMAL HIGH (ref 11.6–15.2)

## 2014-07-16 DIAGNOSIS — I5021 Acute systolic (congestive) heart failure: Secondary | ICD-10-CM

## 2014-07-16 LAB — PROTIME-INR
INR: 4.01 — ABNORMAL HIGH (ref 0.00–1.49)
Prothrombin Time: 39.4 seconds — ABNORMAL HIGH (ref 11.6–15.2)

## 2014-07-16 NOTE — Progress Notes (Signed)
   Name: Guy Garrett MRN: [id number] DOB: 04/19/1956    ADMISSION DATE:  06/24/2014 CONSULTATION DATE: 12/21  [facility]  CHIEF COMPLAINT:  Hypoxia    SIGNIFICANT EVENTS    STUDIES:  Echo 12/26 >> EF 20%, large apical thrombus, RVSP 64, dilated RA/RV   HISTORY OF PRESENT ILLNESS:  58 yo male with a plethora of health issues. General failure to thrive. Vasculopath with ongoing lower ext ischemia , recent left BKA and rt foot severely  Ischemic. [facility] fever and lower ext wound and required care in the ICU and continued high flow O2. And had left AKA  12/11. [facility] and remains hypoxic and lethargic     SUBJECTIVE:   VITAL SIGNS:   Vital signs reviewed. Abnormal values will appear under impression plan section.    PHYSICAL EXAMINATION: General:  Frail, dishevel, male who is awake off bipap, continues to improve HEENT:  Poor dentition, no jvd/lan Cardiovascular:  HSD Lungs: Decreased air movement  Abdomen:  Obese + bs Musculoskeletal: left aka with stump well approximated. Rt foot with dressing and severe ischemic changes, 2+ edema Skin:  cool   Recent Labs Lab 07/12/14 0620  07/13/14 2350 07/14/14 0615 07/14/14 1900 07/15/14 0600  NA 136  --   --  137  --  137  K 3.9  --   --  2.6* 3.7 3.5  CL 95*  --   --  95*  --  93*  CO2 26  --   --  32  --  33*  BUN 23  --   --  23  --  27*  CREATININE 0.84  --   --  0.82  --  0.73  GLUCOSE 337*  < > 448* 316*  --  258*  < > = values in this interval not displayed.  Recent Labs Lab 07/12/14 0620 07/14/14 0615 07/15/14 0600  HGB 10.2* 10.7* 11.1*  HCT 32.9* 35.2* 36.9*  WBC 7.0 15.5* 13.4*  PLT 271 292 301   No results found. Active Problems:   Acute respiratory failure with hypoxia   CHF (congestive heart failure)   CAD (coronary artery disease) of artery bypass graft   S/P AKA (above knee amputation)   Arterial insufficiency of lower extremity   PVD  (peripheral vascular disease)   GERD (gastroesophageal reflux disease)   CVA (cerebral vascular accident)   FTT (failure to thrive) in adult   Tobacco abuse   COPD exacerbation  ASSESSMENT / PLAN:  Discussion: 58 yo male with a plethora of health issues. General failure to thrive. Vasculopath with ongoing lower ext ischemia , recent left BKA and rt foot severely  Ischemic. [facility] fever and lower ext wound and required care in the ICU and continued high flow O2. And had left AKA  12/11. [facility] and remains hypoxic and lethargic. Severe pulm htn, acute systolic CHF with LV thrombus Recommend -  Remains profoundly hypoxic on high flow o2.   Need to clarify code status prior to any consideration of intubation given the patient's plethora of medical problems and poor functional status.  IV lasix -consider drip Cards input- continue anticoagulation for LV clot  CHF ID CVA CAD  [facility]       Oretha Milchakesh Alva V, MD

## 2014-07-17 LAB — PROTIME-INR
INR: 2.65 — ABNORMAL HIGH (ref 0.00–1.49)
Prothrombin Time: 28.4 seconds — ABNORMAL HIGH (ref 11.6–15.2)

## 2014-07-17 LAB — CBC
HCT: 37.8 % — ABNORMAL LOW (ref 39.0–52.0)
HEMOGLOBIN: 11.3 g/dL — AB (ref 13.0–17.0)
MCH: 25.5 pg — AB (ref 26.0–34.0)
MCHC: 29.9 g/dL — AB (ref 30.0–36.0)
MCV: 85.3 fL (ref 78.0–100.0)
Platelets: 309 10*3/uL (ref 150–400)
RBC: 4.43 MIL/uL (ref 4.22–5.81)
RDW: 20.2 % — ABNORMAL HIGH (ref 11.5–15.5)
WBC: 13.3 10*3/uL — ABNORMAL HIGH (ref 4.0–10.5)

## 2014-07-17 LAB — BASIC METABOLIC PANEL
Anion gap: 9 (ref 5–15)
BUN: 31 mg/dL — ABNORMAL HIGH (ref 6–23)
CALCIUM: 8.2 mg/dL — AB (ref 8.4–10.5)
CO2: 31 mmol/L (ref 19–32)
Chloride: 97 mEq/L (ref 96–112)
Creatinine, Ser: 0.67 mg/dL (ref 0.50–1.35)
GFR calc Af Amer: >90 mL/min (ref >90)
GFR calc non Af Amer: >90 mL/min (ref >90)
Glucose, Bld: 224 mg/dL — ABNORMAL HIGH (ref 70–99)
POTASSIUM: 3.6 mmol/L (ref 3.5–5.1)
Sodium: 137 mmol/L (ref 135–145)

## 2014-07-18 LAB — PROTIME-INR
INR: 2.06 — AB (ref 0.00–1.49)
Prothrombin Time: 23.4 seconds — ABNORMAL HIGH (ref 11.6–15.2)

## 2014-07-18 LAB — CLOSTRIDIUM DIFFICILE BY PCR: Toxigenic C. Difficile by PCR: NEGATIVE

## 2014-07-18 NOTE — Progress Notes (Signed)
   Name: Guy Garrett MRN: [id number] DOB: 08/13/1955    ADMISSION DATE:  07/14/2014 CONSULTATION DATE: 12/21  [facility]  CHIEF COMPLAINT:  Hypoxia    SIGNIFICANT EVENTS    STUDIES:  Echo 12/26 >> EF 20%, large apical thrombus, RVSP 64, dilated RA/RV   HISTORY OF PRESENT ILLNESS:  58 yo male with a plethora of health issues. General failure to thrive. Vasculopath with ongoing lower ext ischemia , recent left BKA and rt foot severely  Ischemic. [facility] fever and lower ext wound and required care in the ICU and continued high flow O2. And had left AKA  12/11. [facility] and remains hypoxic and lethargic     SUBJECTIVE: Remains on high flow o2 Denies CP, dyspnea WOB acceptable Poor seal on bipap mask per RT  VITAL SIGNS:   Vital signs reviewed. Abnormal values will appear under impression plan section.    PHYSICAL EXAMINATION: General:  Frail, dishevel, male who is awake off bipap, continues to improve HEENT:  Poor dentition, no jvd/lan Cardiovascular:  s1s2 nml Lungs: Decreased air movement  Abdomen:  Obese + bs Musculoskeletal: left aka with stump well approximated. Rt foot with dressing and severe ischemic changes, 2+ edema Skin:  cool   Recent Labs Lab 07/14/14 0615 07/14/14 1900 07/15/14 0600 07/17/14 0500  NA 137  --  137 137  K 2.6* 3.7 3.5 3.6  CL 95*  --  93* 97  CO2 32  --  33* 31  BUN 23  --  27* 31*  CREATININE 0.82  --  0.73 0.67  GLUCOSE 316*  --  258* 224*    Recent Labs Lab 07/14/14 0615 07/15/14 0600 07/17/14 0500  HGB 10.7* 11.1* 11.3*  HCT 35.2* 36.9* 37.8*  WBC 15.5* 13.4* 13.3*  PLT 292 301 309   No results found. Active Problems:   Acute respiratory failure with hypoxia   CHF (congestive heart failure)   CAD (coronary artery disease) of artery bypass graft   S/P AKA (above knee amputation)   Arterial insufficiency of lower extremity   PVD (peripheral vascular disease)   GERD  (gastroesophageal reflux disease)   CVA (cerebral vascular accident)   FTT (failure to thrive) in adult   Tobacco abuse   COPD exacerbation  ASSESSMENT / PLAN:  Discussion: 58 yo male with a plethora of health issues. Vasculopath with ongoing lower ext ischemia , recent left BKA and rt foot severely  Ischemic. [facility] fever and lower ext wound and required care in the ICU and continued high flow O2. And had left AKA  12/11. [facility] and  Remains profoundly hypoxic on high flow o2. Severe pulmonary hypertension -secondary   Recommend -    Need to clarify code status prior to any consideration of intubation given the patient's plethora of medical problems and poor functional status.  IV lasix -consider drip bipap q hs  Severe pulm htn, acute systolic CHF with LV thrombus -Cards input- continue anticoagulation for LV clot  Cyril Mourningakesh Alva MD. FCCP. [facility] Pager [phone] If no response call [phone]        Oretha Milchakesh Alva V, MD

## 2014-07-19 LAB — PROTIME-INR
INR: 1.98 — ABNORMAL HIGH (ref 0.00–1.49)
Prothrombin Time: 22.7 seconds — ABNORMAL HIGH (ref 11.6–15.2)

## 2014-07-20 ENCOUNTER — Other Ambulatory Visit (HOSPITAL_COMMUNITY): Payer: Self-pay

## 2014-07-20 LAB — PROTIME-INR
INR: 1.87 — ABNORMAL HIGH (ref 0.00–1.49)
Prothrombin Time: 21.7 seconds — ABNORMAL HIGH (ref 11.6–15.2)

## 2014-07-20 LAB — URINE MICROSCOPIC-ADD ON

## 2014-07-20 LAB — URINALYSIS, ROUTINE W REFLEX MICROSCOPIC
BILIRUBIN URINE: NEGATIVE
Glucose, UA: NEGATIVE mg/dL
KETONES UR: NEGATIVE mg/dL
Nitrite: NEGATIVE
Protein, ur: NEGATIVE mg/dL
Specific Gravity, Urine: 1.011 (ref 1.005–1.030)
UROBILINOGEN UA: 1 mg/dL (ref 0.0–1.0)
pH: 7.5 (ref 5.0–8.0)

## 2014-07-20 LAB — BASIC METABOLIC PANEL
ANION GAP: 8 (ref 5–15)
BUN: 31 mg/dL — ABNORMAL HIGH (ref 6–23)
CO2: 38 mmol/L — ABNORMAL HIGH (ref 19–32)
Calcium: 8.6 mg/dL (ref 8.4–10.5)
Chloride: 93 mEq/L — ABNORMAL LOW (ref 96–112)
Creatinine, Ser: 0.75 mg/dL (ref 0.50–1.35)
Glucose, Bld: 262 mg/dL — ABNORMAL HIGH (ref 70–99)
POTASSIUM: 3.8 mmol/L (ref 3.5–5.1)
SODIUM: 139 mmol/L (ref 135–145)

## 2014-07-20 LAB — CBC WITH DIFFERENTIAL/PLATELET
BASOS PCT: 0 % (ref 0–1)
Basophils Absolute: 0 10*3/uL (ref 0.0–0.1)
EOS ABS: 0.1 10*3/uL (ref 0.0–0.7)
Eosinophils Relative: 1 % (ref 0–5)
HCT: 41.7 % (ref 39.0–52.0)
Hemoglobin: 13 g/dL (ref 13.0–17.0)
LYMPHS ABS: 1.9 10*3/uL (ref 0.7–4.0)
Lymphocytes Relative: 9 % — ABNORMAL LOW (ref 12–46)
MCH: 27.1 pg (ref 26.0–34.0)
MCHC: 31.2 g/dL (ref 30.0–36.0)
MCV: 86.9 fL (ref 78.0–100.0)
Monocytes Absolute: 0.3 10*3/uL (ref 0.1–1.0)
Monocytes Relative: 1 % — ABNORMAL LOW (ref 3–12)
Neutro Abs: 18 10*3/uL — ABNORMAL HIGH (ref 1.7–7.7)
Neutrophils Relative %: 89 % — ABNORMAL HIGH (ref 43–77)
PLATELETS: 337 10*3/uL (ref 150–400)
RBC: 4.8 MIL/uL (ref 4.22–5.81)
RDW: 20.1 % — ABNORMAL HIGH (ref 11.5–15.5)
WBC: 20.3 10*3/uL — ABNORMAL HIGH (ref 4.0–10.5)

## 2014-07-20 LAB — MAGNESIUM: MAGNESIUM: 2 mg/dL (ref 1.5–2.5)

## 2014-07-20 LAB — PHOSPHORUS: PHOSPHORUS: 3.7 mg/dL (ref 2.3–4.6)

## 2014-07-20 DEATH — deceased

## 2014-07-21 ENCOUNTER — Other Ambulatory Visit (HOSPITAL_COMMUNITY): Payer: Self-pay

## 2014-07-21 LAB — BASIC METABOLIC PANEL
Anion gap: 12 (ref 5–15)
BUN: 32 mg/dL — AB (ref 6–23)
CALCIUM: 8.9 mg/dL (ref 8.4–10.5)
CO2: 35 mmol/L — AB (ref 19–32)
Chloride: 94 mEq/L — ABNORMAL LOW (ref 96–112)
Creatinine, Ser: 0.68 mg/dL (ref 0.50–1.35)
GFR calc Af Amer: >90 mL/min (ref >90)
Glucose, Bld: 76 mg/dL (ref 70–99)
POTASSIUM: 4 mmol/L (ref 3.5–5.1)
Sodium: 141 mmol/L (ref 135–145)

## 2014-07-21 LAB — CBC WITH DIFFERENTIAL/PLATELET
Basophils Absolute: 0 10*3/uL (ref 0.0–0.1)
Basophils Relative: 0 % (ref 0–1)
EOS ABS: 0.4 10*3/uL (ref 0.0–0.7)
EOS PCT: 1 % (ref 0–5)
HEMATOCRIT: 45.4 % (ref 39.0–52.0)
Hemoglobin: 14.2 g/dL (ref 13.0–17.0)
LYMPHS ABS: 1.6 10*3/uL (ref 0.7–4.0)
Lymphocytes Relative: 6 % — ABNORMAL LOW (ref 12–46)
MCH: 27.2 pg (ref 26.0–34.0)
MCHC: 31.3 g/dL (ref 30.0–36.0)
MCV: 87 fL (ref 78.0–100.0)
MONO ABS: 2.2 10*3/uL — AB (ref 0.1–1.0)
Monocytes Relative: 8 % (ref 3–12)
Neutro Abs: 25 10*3/uL — ABNORMAL HIGH (ref 1.7–7.7)
Neutrophils Relative %: 86 % — ABNORMAL HIGH (ref 43–77)
PLATELETS: 256 10*3/uL (ref 150–400)
RBC: 5.22 MIL/uL (ref 4.22–5.81)
RDW: 20.1 % — ABNORMAL HIGH (ref 11.5–15.5)
WBC: 29.2 10*3/uL — ABNORMAL HIGH (ref 4.0–10.5)

## 2014-07-21 LAB — PROCALCITONIN: Procalcitonin: <0.1 ng/mL

## 2014-07-21 LAB — PROTIME-INR
INR: 1.64 — ABNORMAL HIGH (ref 0.00–1.49)
Prothrombin Time: 19.6 seconds — ABNORMAL HIGH (ref 11.6–15.2)

## 2014-07-21 LAB — SEDIMENTATION RATE: Sed Rate: 3 mm/hr (ref 0–16)

## 2014-07-21 LAB — TSH: TSH: 1.84 u[IU]/mL (ref 0.350–4.500)

## 2014-07-22 LAB — CBC WITH DIFFERENTIAL/PLATELET
BASOS PCT: 0 % (ref 0–1)
Basophils Absolute: 0 10*3/uL (ref 0.0–0.1)
Eosinophils Absolute: 0 10*3/uL (ref 0.0–0.7)
Eosinophils Relative: 0 % (ref 0–5)
HCT: 44.9 % (ref 39.0–52.0)
Hemoglobin: 14.3 g/dL (ref 13.0–17.0)
LYMPHS ABS: 1.1 10*3/uL (ref 0.7–4.0)
Lymphocytes Relative: 4 % — ABNORMAL LOW (ref 12–46)
MCH: 27.4 pg (ref 26.0–34.0)
MCHC: 31.8 g/dL (ref 30.0–36.0)
MCV: 86 fL (ref 78.0–100.0)
MONOS PCT: 4 % (ref 3–12)
Monocytes Absolute: 1.1 10*3/uL — ABNORMAL HIGH (ref 0.1–1.0)
NEUTROS ABS: 26.1 10*3/uL — AB (ref 1.7–7.7)
NEUTROS PCT: 92 % — AB (ref 43–77)
PLATELETS: 276 10*3/uL (ref 150–400)
RBC: 5.22 MIL/uL (ref 4.22–5.81)
RDW: 20 % — ABNORMAL HIGH (ref 11.5–15.5)
WBC: 28.3 10*3/uL — ABNORMAL HIGH (ref 4.0–10.5)

## 2014-07-22 LAB — BLOOD GAS, ARTERIAL
Acid-Base Excess: 5.8 mmol/L — ABNORMAL HIGH (ref 0.0–2.0)
Bicarbonate: 28.8 mEq/L — ABNORMAL HIGH (ref 20.0–24.0)
Delivery systems: POSITIVE
Expiratory PAP: 12
FIO2: 100 %
Inspiratory PAP: 22
LHR: 10 {breaths}/min
Mode: POSITIVE
O2 SAT: 95 %
PCO2 ART: 35.1 mmHg (ref 35.0–45.0)
Patient temperature: 98.6
TCO2: 29.9 mmol/L (ref 0–100)
pH, Arterial: 7.525 — ABNORMAL HIGH (ref 7.350–7.450)
pO2, Arterial: 71.6 mmHg — ABNORMAL LOW (ref 80.0–100.0)

## 2014-07-22 LAB — COMPREHENSIVE METABOLIC PANEL
ALT: 93 U/L — ABNORMAL HIGH (ref 0–53)
ANION GAP: 8 (ref 5–15)
AST: 50 U/L — ABNORMAL HIGH (ref 0–37)
Albumin: 2.7 g/dL — ABNORMAL LOW (ref 3.5–5.2)
Alkaline Phosphatase: 217 U/L — ABNORMAL HIGH (ref 39–117)
BUN: 36 mg/dL — ABNORMAL HIGH (ref 6–23)
CALCIUM: 8.7 mg/dL (ref 8.4–10.5)
CHLORIDE: 98 meq/L (ref 96–112)
CO2: 32 mmol/L (ref 19–32)
Creatinine, Ser: 0.77 mg/dL (ref 0.50–1.35)
GFR calc non Af Amer: >90 mL/min (ref >90)
Glucose, Bld: 217 mg/dL — ABNORMAL HIGH (ref 70–99)
Potassium: 3.6 mmol/L (ref 3.5–5.1)
Sodium: 138 mmol/L (ref 135–145)
TOTAL PROTEIN: 6.8 g/dL (ref 6.0–8.3)
Total Bilirubin: 2.4 mg/dL — ABNORMAL HIGH (ref 0.3–1.2)

## 2014-07-22 LAB — PHOSPHORUS: Phosphorus: 4.3 mg/dL (ref 2.3–4.6)

## 2014-07-22 LAB — URINE CULTURE: Colony Count: 95000

## 2014-07-22 LAB — C-REACTIVE PROTEIN: CRP: 1.4 mg/dL — ABNORMAL HIGH (ref <0.60)

## 2014-07-22 LAB — MAGNESIUM: MAGNESIUM: 2 mg/dL (ref 1.5–2.5)

## 2014-07-22 LAB — PROTIME-INR
INR: 1.67 — ABNORMAL HIGH (ref 0.00–1.49)
PROTHROMBIN TIME: 19.9 s — AB (ref 11.6–15.2)

## 2014-07-23 DIAGNOSIS — J9690 Respiratory failure, unspecified, unspecified whether with hypoxia or hypercapnia: Secondary | ICD-10-CM | POA: Insufficient documentation

## 2014-07-23 DIAGNOSIS — J9611 Chronic respiratory failure with hypoxia: Secondary | ICD-10-CM

## 2014-07-23 LAB — CBC WITH DIFFERENTIAL/PLATELET
BASOS ABS: 0 10*3/uL (ref 0.0–0.1)
BASOS PCT: 0 % (ref 0–1)
EOS ABS: 0.3 10*3/uL (ref 0.0–0.7)
Eosinophils Relative: 1 % (ref 0–5)
HCT: 43.3 % (ref 39.0–52.0)
HEMOGLOBIN: 13.6 g/dL (ref 13.0–17.0)
LYMPHS PCT: 4 % — AB (ref 12–46)
Lymphs Abs: 1.2 10*3/uL (ref 0.7–4.0)
MCH: 26.9 pg (ref 26.0–34.0)
MCHC: 31.4 g/dL (ref 30.0–36.0)
MCV: 85.7 fL (ref 78.0–100.0)
Monocytes Absolute: 1.5 10*3/uL — ABNORMAL HIGH (ref 0.1–1.0)
Monocytes Relative: 5 % (ref 3–12)
Neutro Abs: 26.5 10*3/uL — ABNORMAL HIGH (ref 1.7–7.7)
Neutrophils Relative %: 90 % — ABNORMAL HIGH (ref 43–77)
Platelets: 247 10*3/uL (ref 150–400)
RBC: 5.05 MIL/uL (ref 4.22–5.81)
RDW: 20.2 % — AB (ref 11.5–15.5)
WBC: 29.5 10*3/uL — ABNORMAL HIGH (ref 4.0–10.5)

## 2014-07-23 LAB — COMPREHENSIVE METABOLIC PANEL
ALBUMIN: 2.4 g/dL — AB (ref 3.5–5.2)
ALK PHOS: 204 U/L — AB (ref 39–117)
ALT: 89 U/L — ABNORMAL HIGH (ref 0–53)
ANION GAP: 13 (ref 5–15)
AST: 56 U/L — ABNORMAL HIGH (ref 0–37)
BUN: 37 mg/dL — AB (ref 6–23)
CALCIUM: 9 mg/dL (ref 8.4–10.5)
CO2: 30 mmol/L (ref 19–32)
CREATININE: 0.83 mg/dL (ref 0.50–1.35)
Chloride: 97 mEq/L (ref 96–112)
GFR calc non Af Amer: >90 mL/min (ref >90)
GLUCOSE: 195 mg/dL — AB (ref 70–99)
POTASSIUM: 4 mmol/L (ref 3.5–5.1)
Sodium: 140 mmol/L (ref 135–145)
TOTAL PROTEIN: 7 g/dL (ref 6.0–8.3)
Total Bilirubin: 1.5 mg/dL — ABNORMAL HIGH (ref 0.3–1.2)

## 2014-07-23 LAB — PROTIME-INR
INR: 2.48 — ABNORMAL HIGH (ref 0.00–1.49)
Prothrombin Time: 27.1 seconds — ABNORMAL HIGH (ref 11.6–15.2)

## 2014-07-23 LAB — PHOSPHORUS: Phosphorus: 2.8 mg/dL (ref 2.3–4.6)

## 2014-07-23 LAB — BRAIN NATRIURETIC PEPTIDE: B NATRIURETIC PEPTIDE 5: 406 pg/mL — AB (ref 0.0–100.0)

## 2014-07-23 LAB — MAGNESIUM: MAGNESIUM: 2.1 mg/dL (ref 1.5–2.5)

## 2014-07-23 NOTE — Progress Notes (Signed)
Name: Guy Garrett MRN: [id number] DOB: 1956-05-20    ADMISSION DATE:  07-19-2014 CONSULTATION DATE: 12/21  REFERRING MD :  [facility]  CHIEF COMPLAINT:  Hypoxia    SIGNIFICANT EVENTS    STUDIES:  Echo 12/26 >> EF 20%, large apical thrombus, RVSP 64, dilated RA/RV   HISTORY OF PRESENT ILLNESS:  59 yo male with a plethora of health issues. General failure to thrive. Vasculopath with ongoing lower ext ischemia , recent left BKA and rt foot severely  Ischemic. Presented to [facility] 12/6 with fever and lower ext wound and required care in the ICU and continued high flow O2. And had left AKA  12/11. Transferred to [facility] 12/16 and remains hypoxic and lethargic     SUBJECTIVE: Remains on high flow o2 NAD at rest  VITAL SIGNS:   Vital signs reviewed. Abnormal values will appear under impression plan section.    PHYSICAL EXAMINATION: General:  Frail, dishevel, male who is awake off bipap, NAD despite high flow O2 HEENT:  Poor dentition, no jvd/lan Cardiovascular:  s1s2 nml Lungs: Decreased air movement , no wheeze noted Abdomen:  Obese + bs Musculoskeletal: left aka with stump well approximated. Rt foot with dressing and severe ischemic changes, 2+ edema Skin:  cool   Recent Labs Lab 07/20/14 0530 07/21/14 0500 07/22/14 0708  NA 139 141 138  K 3.8 4.0 3.6  CL 93* 94* 98  CO2 38* 35* 32  BUN 31* 32* 36*  CREATININE 0.75 0.68 0.77  GLUCOSE 262* 76 217*    Recent Labs Lab 07/20/14 0530 07/21/14 0500 07/22/14 0708  HGB 13.0 14.2 14.3  HCT 41.7 45.4 44.9  WBC 20.3* 29.2* 28.3*  PLT 337 256 276   Dg Chest Port 1 View  07/21/2014   CLINICAL DATA:  Acute shortness of breath.  EXAM: PORTABLE CHEST - 1 VIEW  COMPARISON:  07/20/2014 dating back to 06/24/2014.  FINDINGS: Interval development of mild interstitial and airspace pulmonary edema since yesterday. No visible pleural effusions. Cardiac silhouette moderately enlarged but stable. Left subclavian pacing defibrillator  unchanged.  IMPRESSION: Mild CHF and/or fluid overload with mild interstitial and airspace pulmonary edema, new since yesterday.   Electronically Signed   By: Hulan Saas M.D.   On: 07/21/2014 14:16   Active Problems:   Acute respiratory failure with hypoxia   CHF (congestive heart failure)   CAD (coronary artery disease) of artery bypass graft   S/P AKA (above knee amputation)   Arterial insufficiency of lower extremity   PVD (peripheral vascular disease)   GERD (gastroesophageal reflux disease)   CVA (cerebral vascular accident)   FTT (failure to thrive) in adult   Tobacco abuse   COPD exacerbation  ASSESSMENT / PLAN:  Discussion: 59 yo male with a plethora of health issues. Vasculopath with ongoing lower ext ischemia , recent left BKA and rt foot severely  Ischemic. Presented to [facility] 12/6 with fever and lower ext wound and required care in the ICU and continued high flow O2. And had left AKA  12/11. Transferred to [facility] 12/16 and  Remains profoundly hypoxic on high flow o2. Severe pulmonary hypertension -secondary 1/4 continues to need high flow O2   Recommend -    Need to clarify code status prior to any consideration of intubation given the patient's plethora of medical problems and poor functional status. If intubated would trach early. IV lasix - bipap q hs(sometimes refuses) Severe pulm htn, acute systolic CHF with LV thrombus -Cards input- continue anticoagulation for  LV clot    Brett Canales Minor ACNP [facility] Pager [phone] till 3 pm If no answer page [phone] 07/23/2014, 9:35 AM

## 2014-07-24 ENCOUNTER — Other Ambulatory Visit: Payer: Self-pay

## 2014-07-24 ENCOUNTER — Other Ambulatory Visit (HOSPITAL_COMMUNITY): Payer: Self-pay

## 2014-07-24 LAB — CBC WITH DIFFERENTIAL/PLATELET
BASOS ABS: 0 10*3/uL (ref 0.0–0.1)
Basophils Relative: 0 % (ref 0–1)
EOS PCT: 1 % (ref 0–5)
Eosinophils Absolute: 0.3 10*3/uL (ref 0.0–0.7)
HEMATOCRIT: 43.5 % (ref 39.0–52.0)
Hemoglobin: 13.7 g/dL (ref 13.0–17.0)
Lymphocytes Relative: 5 % — ABNORMAL LOW (ref 12–46)
Lymphs Abs: 1.3 10*3/uL (ref 0.7–4.0)
MCH: 27.5 pg (ref 26.0–34.0)
MCHC: 31.5 g/dL (ref 30.0–36.0)
MCV: 87.2 fL (ref 78.0–100.0)
Monocytes Absolute: 1.3 10*3/uL — ABNORMAL HIGH (ref 0.1–1.0)
Monocytes Relative: 5 % (ref 3–12)
NEUTROS ABS: 22.7 10*3/uL — AB (ref 1.7–7.7)
Neutrophils Relative %: 89 % — ABNORMAL HIGH (ref 43–77)
PLATELETS: 252 10*3/uL (ref 150–400)
RBC: 4.99 MIL/uL (ref 4.22–5.81)
RDW: 20 % — AB (ref 11.5–15.5)
WBC: 25.6 10*3/uL — ABNORMAL HIGH (ref 4.0–10.5)

## 2014-07-24 LAB — PROTIME-INR
INR: 2.89 — AB (ref 0.00–1.49)
PROTHROMBIN TIME: 30.5 s — AB (ref 11.6–15.2)

## 2014-07-24 LAB — PHOSPHORUS: Phosphorus: 3.9 mg/dL (ref 2.3–4.6)

## 2014-07-24 LAB — BASIC METABOLIC PANEL
ANION GAP: 15 (ref 5–15)
BUN: 38 mg/dL — ABNORMAL HIGH (ref 6–23)
CO2: 28 mmol/L (ref 19–32)
Calcium: 9 mg/dL (ref 8.4–10.5)
Chloride: 98 mEq/L (ref 96–112)
Creatinine, Ser: 0.81 mg/dL (ref 0.50–1.35)
GFR calc non Af Amer: >90 mL/min (ref >90)
Glucose, Bld: 89 mg/dL (ref 70–99)
Potassium: 4.1 mmol/L (ref 3.5–5.1)
Sodium: 141 mmol/L (ref 135–145)

## 2014-07-24 LAB — CK TOTAL AND CKMB (NOT AT ARMC)
CK TOTAL: 86 U/L (ref 7–232)
CK, MB: 3.8 ng/mL (ref 0.3–4.0)
Relative Index: INVALID (ref 0.0–2.5)

## 2014-07-24 LAB — TROPONIN I: Troponin I: 0.14 ng/mL — ABNORMAL HIGH (ref <0.031)

## 2014-07-24 LAB — MAGNESIUM: Magnesium: 2 mg/dL (ref 1.5–2.5)

## 2014-07-25 LAB — PROTIME-INR
INR: 3.98 — AB (ref 0.00–1.49)
Prothrombin Time: 39.1 seconds — ABNORMAL HIGH (ref 11.6–15.2)

## 2014-07-25 LAB — CULTURE, BLOOD (ROUTINE X 2)

## 2014-07-25 MED FILL — Medication: Qty: 1 | Status: AC

## 2014-07-27 LAB — CULTURE, BLOOD (ROUTINE X 2): CULTURE: NO GROWTH

## 2014-08-20 DEATH — deceased

## 2015-08-22 IMAGING — NM NM PULMONARY VENT & PERF
15 series · 15 of 15 positions shown · non-contrast
Comparison: 07/09/2014

CLINICAL DATA: Respiratory failure with hypoxia

EXAM:
NUCLEAR MEDICINE VENTILATION - PERFUSION LUNG SCAN
TECHNIQUE: Ventilation images were obtained in multiple projections using
inhaled aerosol technetium 99 M DTPA. Perfusion images were obtained
in multiple projections after intravenous injection of Lc-IIm MAA.
RADIOPHARMACEUTICALS:  40 mCi Lc-IIm DTPA aerosol and 6 mCi Lc-IIm
MAA

[Series 1: ant/post vent · 4.14mm/px · 1 of 1 slices shown (1 of 2)]
[im 1/1]
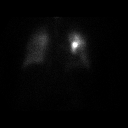

[Series 1: ant/post vent · 4.14mm/px · 1 of 1 slices shown (2 of 2)]
[im 1/1]
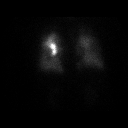

[Series 2: lao/rpo vent · 4.14mm/px · 1 of 1 slices shown (1 of 2)]
[im 1/1]
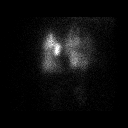

[Series 2: lao/rpo vent · 4.14mm/px · 1 of 1 slices shown (2 of 2)]
[im 1/1]
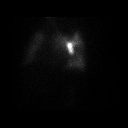

[Series 3: lpo/rao vent · 4.14mm/px · 1 of 1 slices shown (1 of 2)]
[im 1/1]
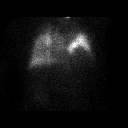

[Series 3: lpo/rao vent · 4.14mm/px · 1 of 1 slices shown (2 of 2)]
[im 1/1]
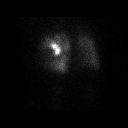

[Series 4: lt lat/rt lat vent · 4.14mm/px · 1 of 1 slices shown (1 of 2)]
[im 1/1]
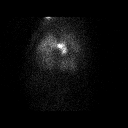

[Series 4: lt lat/rt lat vent · 4.14mm/px · 1 of 1 slices shown (2 of 2)]
[im 1/1]
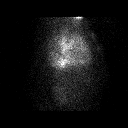

[Series 5: lt lat/rt lat perf · 4.14mm/px · 1 of 1 slices shown (1 of 2)]
[im 1/1]
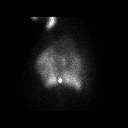

[Series 5: lt lat/rt lat perf · 4.14mm/px · 1 of 1 slices shown (2 of 2)]
[im 1/1]
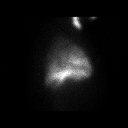

[Series 6: lpo/rao perf · 4.14mm/px · 1 of 1 slices shown (1 of 2)]
[im 1/1]
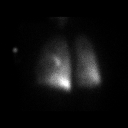

[Series 6: lpo/rao perf · 4.14mm/px · 1 of 1 slices shown (2 of 2)]
[im 1/1]
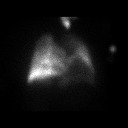

[Series 7: ant/post perf · 4.14mm/px · 1 of 1 slices shown]
[im 1/1]
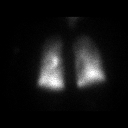

[Series 8: lao/rpo perf · 4.14mm/px · 1 of 1 slices shown (1 of 2)]
[im 1/1]
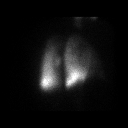

[Series 8: lao/rpo perf · 4.14mm/px · 1 of 1 slices shown (2 of 2)]
[im 1/1]
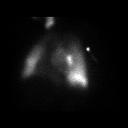

[15 of 15 positions shown; findings below may reference images not displayed]

FINDINGS: Ventilation: No focal ventilation defect.

Perfusion: No wedge shaped peripheral perfusion defects to suggest
acute pulmonary embolism. A defect is noted related to the pacemaker
anteriorly on the left.
IMPRESSION: No evidence of pulmonary emboli.

## 2015-09-02 IMAGING — CR DG CHEST 1V PORT
1 series · 1 of 1 positions shown · non-contrast
Comparison: 07/20/2014 dating back to 06/24/2014.

CLINICAL DATA: Acute shortness of breath.

EXAM:
PORTABLE CHEST - 1 VIEW

[AP]
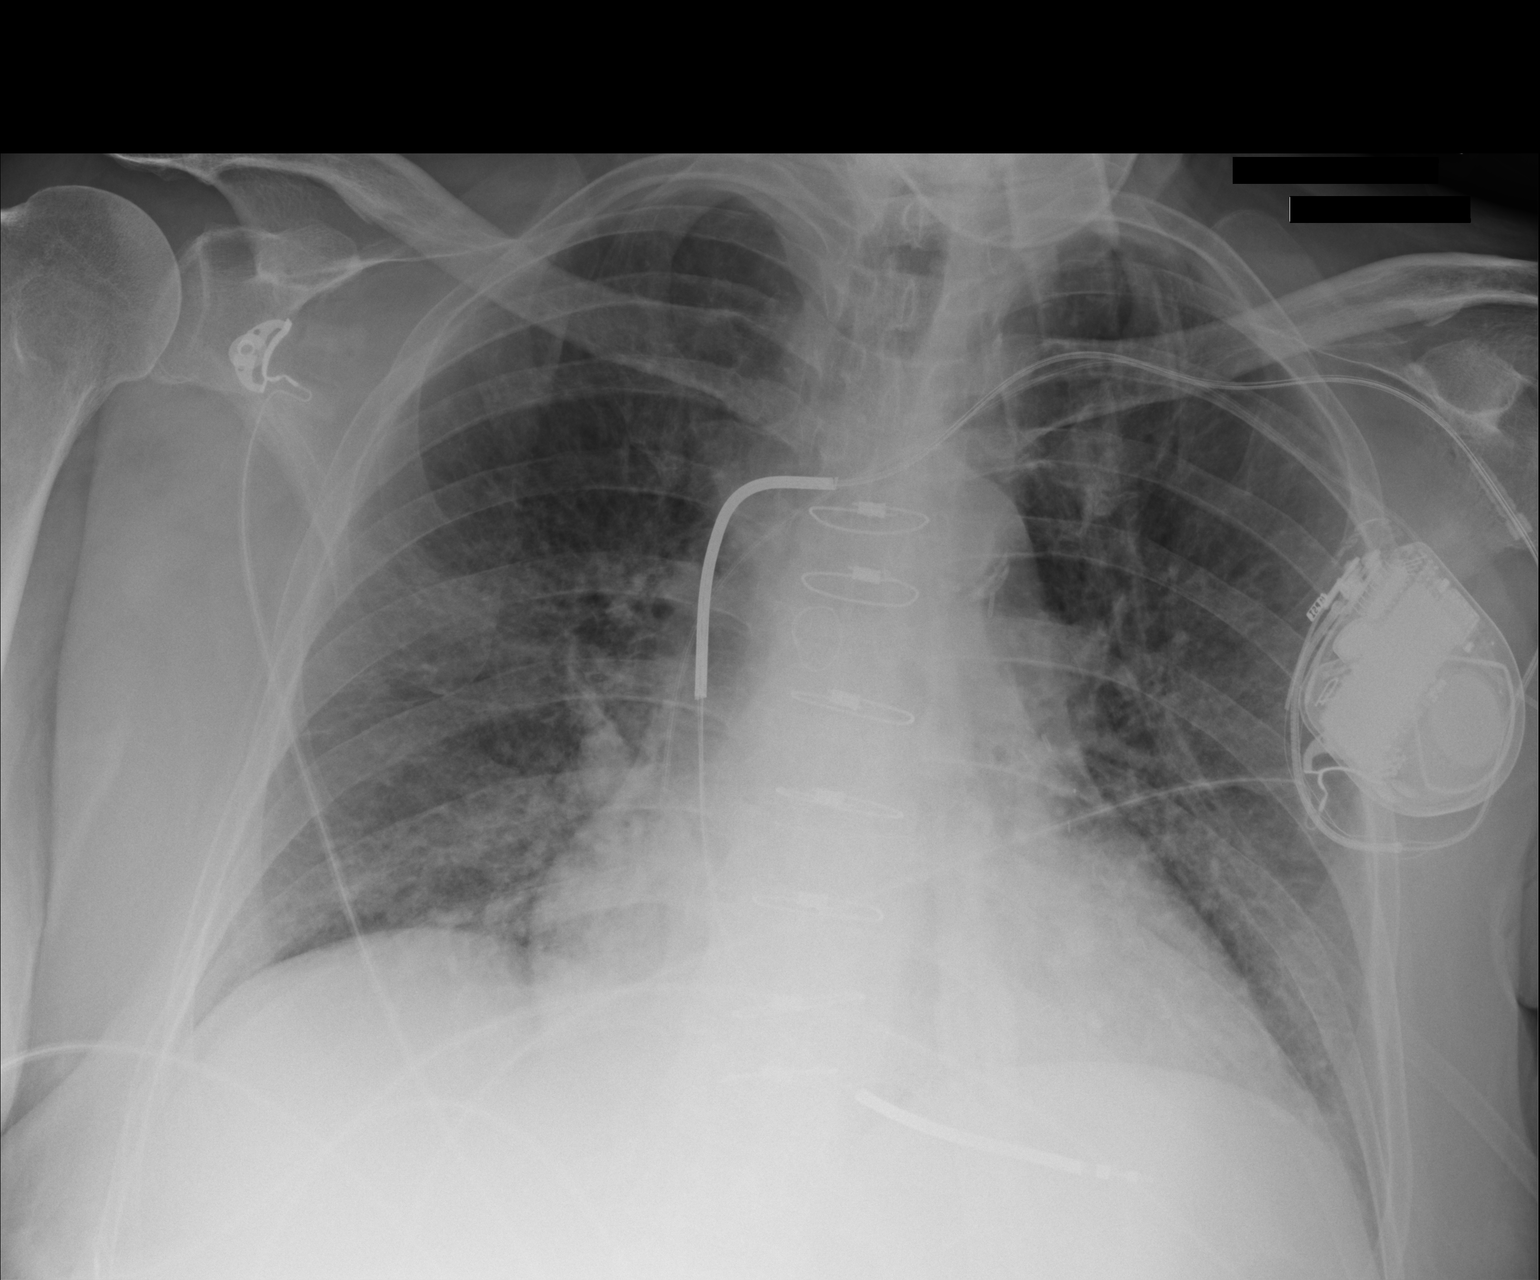

[1 of 1 positions shown; findings below may reference images not displayed]

FINDINGS: Interval development of mild interstitial and airspace pulmonary
edema since yesterday. No visible pleural effusions. Cardiac
silhouette moderately enlarged but stable. Left subclavian pacing
defibrillator unchanged.
IMPRESSION: Mild CHF and/or fluid overload with mild interstitial and airspace
pulmonary edema, new since yesterday.

## 2015-09-05 IMAGING — CR DG CHEST 1V PORT
1 series · 1 of 1 positions shown · non-contrast
Comparison: July 21, 2014.

CLINICAL DATA: Right-sided chest pain and shortness of breath
starting today.

EXAM:
PORTABLE CHEST - 1 VIEW

[portable]
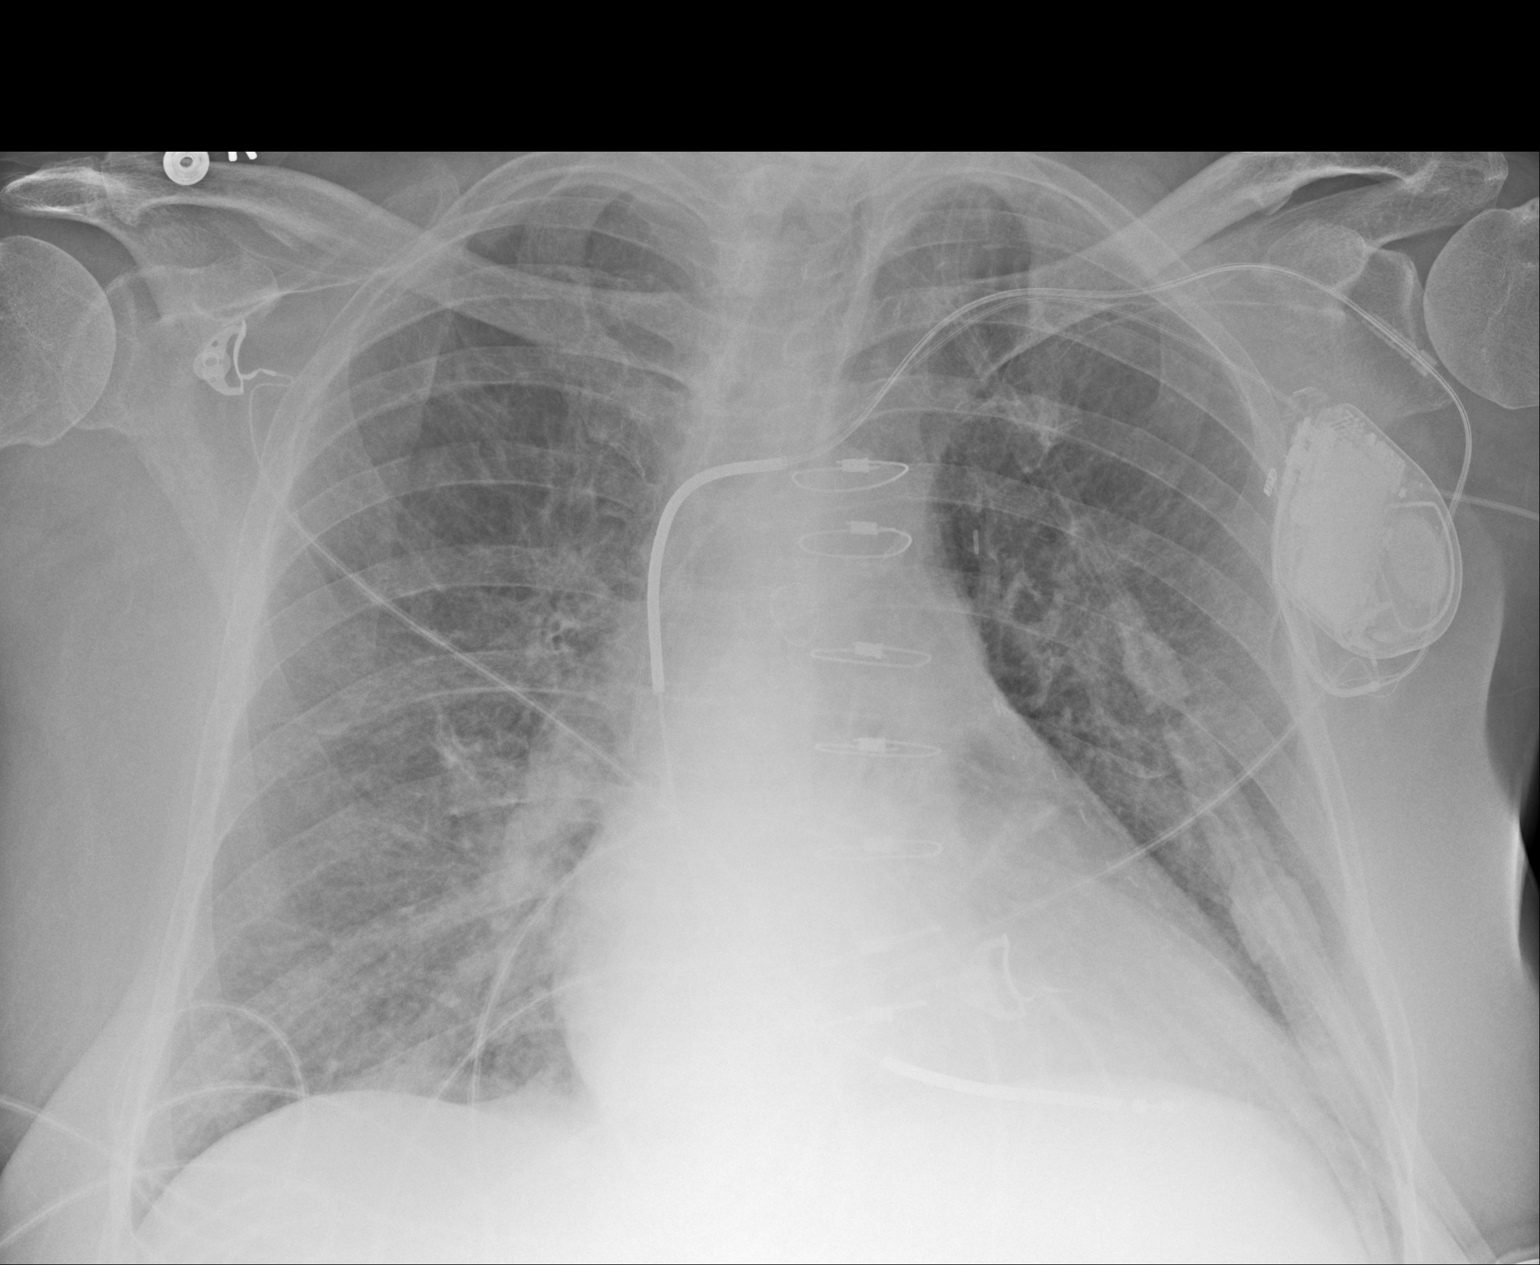

[1 of 1 positions shown; findings below may reference images not displayed]

FINDINGS: Stable cardiomediastinal silhouette. Status post coronary artery
bypass graft. Left-sided pacemaker is unchanged in position. No
pneumothorax or significant pleural effusion is noted. Minimal
bilateral perihilar edema is noted.
IMPRESSION: Minimal bilateral perihilar pulmonary edema is noted.
